# Patient Record
Sex: Male | Born: 2000 | Hispanic: No | Marital: Single | Smoking: Former smoker
Health system: Southern US, Community
[De-identification: ages and names within clinical notes are randomized; demographics above are authoritative.]

## PROBLEM LIST (undated history)

## (undated) DIAGNOSIS — A048 Other specified bacterial intestinal infections: Secondary | ICD-10-CM

---

## 2018-06-11 ENCOUNTER — Emergency Department
Admission: EM | Admit: 2018-06-11 | Discharge: 2018-06-11 | Disposition: A | Discharge status: Home / Self Care | Payer: BLUE CROSS/BLUE SHIELD | Attending: Emergency Medicine | Admitting: Emergency Medicine

## 2018-06-11 ENCOUNTER — Emergency Department: Payer: BLUE CROSS/BLUE SHIELD

## 2018-06-11 ENCOUNTER — Encounter: Payer: Self-pay | Admitting: Emergency Medicine

## 2018-06-11 DIAGNOSIS — R112 Nausea with vomiting, unspecified: Secondary | ICD-10-CM | POA: Diagnosis present

## 2018-06-11 DIAGNOSIS — R109 Unspecified abdominal pain: Secondary | ICD-10-CM | POA: Diagnosis not present

## 2018-06-11 DIAGNOSIS — R110 Nausea: Secondary | ICD-10-CM | POA: Diagnosis not present

## 2018-06-11 LAB — COMPREHENSIVE METABOLIC PANEL
ALT: 7 U/L (ref 0–44)
AST: 23 U/L (ref 15–41)
Albumin: 4.8 g/dL (ref 3.5–5.0)
Alkaline Phosphatase: 84 U/L (ref 38–126)
Anion gap: 8 (ref 5–15)
BUN: 13 mg/dL (ref 6–20)
CO2: 24 mmol/L (ref 22–32)
Calcium: 9.7 mg/dL (ref 8.9–10.3)
Chloride: 105 mmol/L (ref 98–111)
Creatinine, Ser: 1.23 mg/dL (ref 0.61–1.24)
GFR calc non Af Amer: >60 mL/min (ref >60)
Glucose, Bld: 97 mg/dL (ref 70–99)
Potassium: 3.6 mmol/L (ref 3.5–5.1)
Sodium: 137 mmol/L (ref 135–145)
Total Bilirubin: 1.5 mg/dL — ABNORMAL HIGH (ref 0.3–1.2)
Total Protein: 7.9 g/dL (ref 6.5–8.1)

## 2018-06-11 LAB — CBC
HCT: 44.6 % (ref 39.0–52.0)
Hemoglobin: 16 g/dL (ref 13.0–17.0)
MCH: 31 pg (ref 26.0–34.0)
MCHC: 35.9 g/dL (ref 30.0–36.0)
MCV: 86.4 fL (ref 80.0–100.0)
Platelets: 274 10*3/uL (ref 150–400)
RBC: 5.16 MIL/uL (ref 4.22–5.81)
RDW: 11.7 % (ref 11.5–15.5)
WBC: 6.9 10*3/uL (ref 4.0–10.5)
nRBC: 0 % (ref 0.0–0.2)

## 2018-06-11 LAB — URINALYSIS, COMPLETE (UACMP) WITH MICROSCOPIC
Bacteria, UA: NONE SEEN
Bilirubin Urine: NEGATIVE
Glucose, UA: NEGATIVE mg/dL
Hgb urine dipstick: NEGATIVE
Ketones, ur: 20 mg/dL — AB
Leukocytes, UA: NEGATIVE
Nitrite: NEGATIVE
Protein, ur: NEGATIVE mg/dL
Specific Gravity, Urine: 1.025 (ref 1.005–1.030)
Squamous Epithelial / HPF: NONE SEEN (ref 0–5)
pH: 6 (ref 5.0–8.0)

## 2018-06-11 LAB — LIPASE, BLOOD: Lipase: 26 U/L (ref 11–51)

## 2018-06-11 MED ORDER — ONDANSETRON 4 MG PO TBDP: 4.0000 mg | ORAL_TABLET | Freq: Once | ORAL | Status: DC

## 2018-06-11 MED ORDER — IOPAMIDOL (ISOVUE-300) INJECTION 61%
30.0000 mL | Freq: Once | INTRAVENOUS | Status: AC
  Administered 2018-06-11: 30 mL via ORAL
  Filled 2018-06-11: qty 30

## 2018-06-11 MED ORDER — SODIUM CHLORIDE 0.9 % IV BOLUS
1000.0000 mL | Freq: Once | INTRAVENOUS | Status: AC
  Administered 2018-06-11: 1000 mL via INTRAVENOUS

## 2018-06-11 MED ORDER — IOHEXOL 300 MG/ML  SOLN
100.0000 mL | Freq: Once | INTRAMUSCULAR | Status: AC | PRN
  Administered 2018-06-11: 100 mL via INTRAVENOUS
  Filled 2018-06-11: qty 100

## 2018-06-11 MED ORDER — ONDANSETRON 4 MG PO TBDP: 4.0000 mg | ORAL_TABLET | Freq: Four times a day (QID) | ORAL | 0 refills | Status: DC | PRN

## 2018-06-11 MED ORDER — ONDANSETRON HCL 4 MG/2ML IJ SOLN
4.0000 mg | Freq: Once | INTRAMUSCULAR | Status: AC
  Administered 2018-06-11: 4 mg via INTRAVENOUS
  Filled 2018-06-11: qty 2

## 2018-06-11 NOTE — Discharge Instructions (Addendum)

## 2018-06-11 NOTE — ED Provider Notes (Signed)
[facility] Emergency Department Provider Note   ____________________________________________   First MD Initiated Contact with Patient 06/11/18 1733     (approximate)  I have reviewed the triage vital signs and the nursing notes.   HISTORY  Chief Complaint Abdominal Pain; Nausea; and Emesis    HPI Tim Arnold is a 18 y.o. male here for evaluation of nausea and vomiting.  Patient reports his doctor gave him Truvada because he frequently will engage in high risk activities that could lead to HIV.  Reports he had thought about doing something, started the Truvada, but did not actually end up engaging in the high risk activity.  However, day after he started taking he started noticing he felt nauseated having some crampy abdominal pain and has vomited a couple of times since.  He discontinued the drug 2 days ago,  reports he is continued to feel intermittently nauseated and vomited once this morning.  No black or bloody emesis.  No diarrhea.  No fevers or chills.  Does report he has a "baseline" level of nausea, but seem to be quite a bit worse the last few days.  Denies any penile symptoms, rash, or urethral discharge.  No recent travel.  [facility] Reglan prescription which she reports has not been helpful, also seen in urgent care today and was told he needed to come to the ER to have this further evaluated  Reports the pain is inconsistent, presently mild and mid abdominal.  Denies any previous abdominal surgeries.  No chest pain or trouble breathing.  He reports that he is not HIV positive  History reviewed. No pertinent past medical history.  There are no active problems to display for this patient.   History reviewed. No pertinent surgical history.  Prior to Admission medications   Medication Sig Start Date End Date Taking? Authorizing Provider  ondansetron (ZOFRAN ODT) 4 MG disintegrating tablet Take 1  tablet (4 mg total) by mouth every 6 (six) hours as needed for nausea or vomiting. 06/11/18   Sharyn Creamer, MD    Allergies Patient has no allergy information on record.  No family history on file.  Social History Social History   Denies illicit drug use Does occasionally smoke   Review of Systems Constitutional: No fever/chills Eyes: No visual changes. ENT: No sore throat. Cardiovascular: Denies chest pain. Respiratory: Denies shortness of breath. Gastrointestinal: See HPI Genitourinary: Negative for dysuria. Musculoskeletal: Negative for back pain. Skin: Negative for rash. Neurological: Negative for headaches, areas of focal weakness or numbness.    ____________________________________________   PHYSICAL EXAM:  VITAL SIGNS: ED Triage Vitals  Enc Vitals Group     BP 06/11/18 1429 122/72     Pulse Rate 06/11/18 1429 70     Resp 06/11/18 1429 20     Temp 06/11/18 1429 98.2 F (36.8 C)     Temp Source 06/11/18 1429 Oral     SpO2 06/11/18 1429 100 %     Weight 06/11/18 1430 162 lb (73.5 kg)     Height 06/11/18 1430 6\' 4"  (1.93 m)     Head Circumference --      Peak Flow --      Pain Score 06/11/18 1430 6     Pain Loc --      Pain Edu? --      Excl. in GC? --     Constitutional: Alert and oriented. Well appearing and in no acute distress. Eyes:  Conjunctivae are normal. Head: Atraumatic. Nose: No congestion/rhinnorhea. Mouth/Throat: Mucous membranes are moist. Neck: No stridor.  Cardiovascular: Normal rate, regular rhythm. Grossly normal heart sounds.  Good peripheral circulation. Respiratory: Normal respiratory effort.  No retractions. Lungs CTAB. Gastrointestinal: Soft and ports mild generalized tenderness throughout, no obvious focal findings.  No rebound or guarding.  No focal pain to McBurney's point.. No distention. Musculoskeletal: No lower extremity tenderness nor edema. Neurologic:  Normal speech and language. No gross focal neurologic deficits are  appreciated.  Skin:  Skin is warm, dry and intact. No rash noted. Psychiatric: Mood and affect are normal. Speech and behavior are normal.  ____________________________________________   LABS (all labs ordered are listed, but only abnormal results are displayed)  Labs Reviewed  COMPREHENSIVE METABOLIC PANEL - Abnormal; Notable for the following components:      Result Value   Total Bilirubin 1.5 (*)    All other components within normal limits  URINALYSIS, COMPLETE (UACMP) WITH MICROSCOPIC - Abnormal; Notable for the following components:   Color, Urine YELLOW (*)    APPearance CLEAR (*)    Ketones, ur 20 (*)    All other components within normal limits  LIPASE, BLOOD  CBC   ____________________________________________  EKG   ____________________________________________  RADIOLOGY  Ct Abdomen Pelvis W Contrast  Result Date: 06/11/2018 CLINICAL DATA:  Acute generalized abdominal pain. EXAM: CT ABDOMEN AND PELVIS WITH CONTRAST TECHNIQUE: Multidetector CT imaging of the abdomen and pelvis was performed using the standard protocol following bolus administration of intravenous contrast. CONTRAST:  OMNIPAQUE IOHEXOL 300 MG/ML  SOLN COMPARISON:  None. FINDINGS: Lower chest: Normal heart size. Lung bases are clear. No pleural effusion. Hepatobiliary: Liver is normal in size and contour. No focal hepatic lesion is identified. Gallbladder is unremarkable. No intrahepatic or extrahepatic biliary ductal dilatation. Pancreas: Unremarkable Spleen: Unremarkable Adrenals/Urinary Tract: Adrenal glands are normal. Kidneys enhance symmetrically with contrast. No hydronephrosis. Urinary bladder is unremarkable. Stomach/Bowel: Normal morphology of the stomach. No evidence for bowel obstruction. Normal appendix. Normal appearance of the colon. Vascular/Lymphatic: Normal caliber abdominal aorta. No retroperitoneal lymphadenopathy. Reproductive: Unremarkable Other: Small amount of free fluid in the  pelvis. Musculoskeletal: No aggressive or acute appearing osseous lesions. IMPRESSION: Small amount of nonspecific fluid in the pelvis. Otherwise no acute process within the abdomen or pelvis. Electronically Signed   By: Annia Belt M.D.   On: 06/11/2018 19:04     CT scan results reviewed by me, also reviewed by Dr. Aleen Campi and discussed with him. ____________________________________________   PROCEDURES  Procedure(s) performed: None  Procedures  Critical Care performed: No  ____________________________________________   INITIAL IMPRESSION / ASSESSMENT AND PLAN / ED COURSE  Pertinent labs & imaging results that were available during my care of the patient were reviewed by me and considered in my medical decision making (see chart for details).   Differential diagnosis includes but is not limited to, abdominal perforation, aortic dissection, cholecystitis, appendicitis, diverticulitis, colitis, esophagitis/gastritis, kidney stone, pyelonephritis, urinary tract infection, aortic aneurysm. All are considered in decision and treatment plan. Based upon the patient's presentation and risk factors, I suspect the symptoms may very well be likely to his use of Truvada as these are common side effects with this medication.   Given this is the patient's third visit for abdominal pain in the last week I think it is reasonable proceed with CT imaging to exclude conditions such as early appendicitis or other intra-abdominal infection though my pretest probability is low.   Clinical Course as  of Jun 11 1950  Thu Jun 11, 2018  [id number] Dr. Aleen CampiPiscoya reviewed case and CT imaging with me at this time. Discussed trace fluid on CT. Dr. Aleen CampiPiscoya no specific findings and could be secondary to medication. Will discharge with careful return precautions and antiemetic.    [MQ]    Clinical Course User Index [MQ] Sharyn CreamerQuale, Kemyah Buser, MD    ----------------------------------------- 7:53 PM on  06/11/2018 -----------------------------------------  Patient reports he feels much better after receiving fluids and Zofran.  Resting comfortably.  Discussed careful return precautions and follow-up.  He will discontinue Truvada as well as high risk activities for HIV and reconnect with his primary care physician.  Careful abdominal pain return precautions discussed with patient is in agreement.  Return precautions and treatment recommendations and follow-up discussed with the patient who is agreeable with the plan.  ____________________________________________   FINAL CLINICAL IMPRESSION(S) / ED DIAGNOSES  Final diagnoses:  Nausea        Note:  This document was prepared using Dragon voice recognition software and may include unintentional dictation errors       Sharyn CreamerQuale, Eugenio Dollins, MD 06/11/18 1956

## 2018-06-11 NOTE — ED Triage Notes (Signed)
Pt reports started a new medication Truvada a week ago and that is when his sx;s started. Pt c/o lower abd pain, NV. Pt states that he stopped taking the meds 2 days ago but his abd is stil tender and he is still nauseated.

## 2018-07-06 ENCOUNTER — Other Ambulatory Visit: Payer: Self-pay

## 2018-07-06 ENCOUNTER — Emergency Department
Admission: EM | Admit: 2018-07-06 | Discharge: 2018-07-06 | Disposition: A | Discharge status: Home / Self Care | Payer: BLUE CROSS/BLUE SHIELD | Attending: Emergency Medicine | Admitting: Emergency Medicine

## 2018-07-06 ENCOUNTER — Emergency Department: Payer: BLUE CROSS/BLUE SHIELD

## 2018-07-06 DIAGNOSIS — R1032 Left lower quadrant pain: Secondary | ICD-10-CM

## 2018-07-06 DIAGNOSIS — R110 Nausea: Secondary | ICD-10-CM | POA: Diagnosis not present

## 2018-07-06 LAB — CBC
HCT: 46.3 % (ref 39.0–52.0)
Hemoglobin: 16.2 g/dL (ref 13.0–17.0)
MCH: 30.9 pg (ref 26.0–34.0)
MCHC: 35 g/dL (ref 30.0–36.0)
MCV: 88.2 fL (ref 80.0–100.0)
Platelets: 285 10*3/uL (ref 150–400)
RBC: 5.25 MIL/uL (ref 4.22–5.81)
RDW: 11.8 % (ref 11.5–15.5)
WBC: 9.5 10*3/uL (ref 4.0–10.5)
nRBC: 0 % (ref 0.0–0.2)

## 2018-07-06 LAB — COMPREHENSIVE METABOLIC PANEL
ALBUMIN: 4.9 g/dL (ref 3.5–5.0)
ALT: 9 U/L (ref 0–44)
AST: 20 U/L (ref 15–41)
Alkaline Phosphatase: 92 U/L (ref 38–126)
Anion gap: 7 (ref 5–15)
BUN: 14 mg/dL (ref 6–20)
CHLORIDE: 104 mmol/L (ref 98–111)
CO2: 27 mmol/L (ref 22–32)
Calcium: 9.9 mg/dL (ref 8.9–10.3)
Creatinine, Ser: 1.19 mg/dL (ref 0.61–1.24)
GFR calc Af Amer: >60 mL/min (ref >60)
GFR calc non Af Amer: >60 mL/min (ref >60)
Glucose, Bld: 140 mg/dL — ABNORMAL HIGH (ref 70–99)
Potassium: 4.2 mmol/L (ref 3.5–5.1)
Sodium: 138 mmol/L (ref 135–145)
Total Bilirubin: 1.2 mg/dL (ref 0.3–1.2)
Total Protein: 8.3 g/dL — ABNORMAL HIGH (ref 6.5–8.1)

## 2018-07-06 LAB — URINALYSIS, COMPLETE (UACMP) WITH MICROSCOPIC
Bacteria, UA: NONE SEEN
Bilirubin Urine: NEGATIVE
Glucose, UA: NEGATIVE mg/dL
Hgb urine dipstick: NEGATIVE
Ketones, ur: 20 mg/dL — AB
Leukocytes, UA: NEGATIVE
Nitrite: NEGATIVE
PROTEIN: NEGATIVE mg/dL
Specific Gravity, Urine: 1.029 (ref 1.005–1.030)
pH: 5 (ref 5.0–8.0)

## 2018-07-06 LAB — LIPASE, BLOOD: Lipase: 25 U/L (ref 11–51)

## 2018-07-06 MED ORDER — DICYCLOMINE HCL 20 MG PO TABS: 20.0000 mg | ORAL_TABLET | Freq: Three times a day (TID) | ORAL | 0 refills | Status: DC | PRN

## 2018-07-06 MED ORDER — ALUM & MAG HYDROXIDE-SIMETH 200-200-20 MG/5ML PO SUSP
30.0000 mL | Freq: Once | ORAL | Status: AC
  Administered 2018-07-06: 30 mL via ORAL
  Filled 2018-07-06: qty 30

## 2018-07-06 MED ORDER — DICYCLOMINE HCL 20 MG PO TABS
20.0000 mg | ORAL_TABLET | Freq: Once | ORAL | Status: AC
  Administered 2018-07-06: 20 mg via ORAL
  Filled 2018-07-06 (×2): qty 1

## 2018-07-06 MED ORDER — POLYETHYLENE GLYCOL 3350 17 G PO PACK: 17.0000 g | PACK | Freq: Every day | ORAL | 0 refills | Status: DC

## 2018-07-06 MED ORDER — SODIUM CHLORIDE 0.9% FLUSH: 3.0000 mL | Freq: Once | INTRAVENOUS | Status: DC

## 2018-07-06 MED ORDER — LIDOCAINE VISCOUS HCL 2 % MT SOLN
15.0000 mL | Freq: Once | OROMUCOSAL | Status: AC
  Administered 2018-07-06: 15 mL via ORAL
  Filled 2018-07-06: qty 15

## 2018-07-06 NOTE — ED Provider Notes (Signed)
[facility] Emergency Department Provider Note       Time seen: ----------------------------------------- 1:05 PM on 07/06/2018 -----------------------------------------   I have reviewed the triage vital signs and the nursing notes.  HISTORY   Chief Complaint Abdominal Pain    HPI Tim Arnold is a 18 y.o. male with no significant past medical history who presents to the ED for left-sided abdominal pain.  Patient was seen here around a month ago for the same thing without any specific diagnosis.  He underwent negative laboratory and CT imaging at that time.  He thought it may be due to an anti-HIV medication but he has stopped taking that.  He has been referred to a GI doctor for consultation.  He has not moved his bowels in several days, he is also been nauseous.  History reviewed. No pertinent past medical history.  There are no active problems to display for this patient.   History reviewed. No pertinent surgical history.  Allergies Patient has no allergy information on record.  Social History Social History   Tobacco Use  . Smoking status: Not on file  Substance Use Topics  . Alcohol use: Not on file  . Drug use: Not on file   Review of Systems Constitutional: Negative for fever. Cardiovascular: Negative for chest pain. Respiratory: Negative for shortness of breath. Gastrointestinal: Positive for abdominal pain, constipation Musculoskeletal: Negative for back pain. Skin: Negative for rash. Neurological: Negative for headaches, focal weakness or numbness.  All systems negative/normal/unremarkable except as stated in the HPI  ____________________________________________   PHYSICAL EXAM:  VITAL SIGNS: ED Triage Vitals  Enc Vitals Group     BP 07/06/18 1217 (!) 143/92     Pulse Rate 07/06/18 1217 79     Resp 07/06/18 1217 18     Temp 07/06/18 1217 98.4 F (36.9 C)     Temp src --      SpO2 07/06/18 1217 100 %     Weight  07/06/18 1218 162 lb (73.5 kg)     Height 07/06/18 1218 6\' 4"  (1.93 m)     Head Circumference --      Peak Flow --      Pain Score 07/06/18 1217 10     Pain Loc --      Pain Edu? --      Excl. in GC? --    Constitutional: Alert and oriented. Well appearing and in no distress. Eyes: Conjunctivae are normal. Normal extraocular movements. Cardiovascular: Normal rate, regular rhythm. No murmurs, rubs, or gallops. Respiratory: Normal respiratory effort without tachypnea nor retractions. Breath sounds are clear and equal bilaterally. No wheezes/rales/rhonchi. Gastrointestinal: Soft and nontender. Normal bowel sounds Musculoskeletal: Nontender with normal range of motion in extremities. No lower extremity tenderness nor edema. Neurologic:  Normal speech and language. No gross focal neurologic deficits are appreciated.  Skin:  Skin is warm, dry and intact. No rash noted. Psychiatric: Mood and affect are normal. Speech and behavior are normal.  ____________________________________________  ED COURSE:  As part of my medical decision making, I reviewed the following data within the electronic MEDICAL RECORD NUMBER History obtained from family if available, nursing notes, old chart and ekg, as well as notes from prior ED visits. Patient presented for abdominal pain, we will assess with labs and imaging as indicated at this time.   Procedures ____________________________________________   LABS (pertinent positives/negatives)  Labs Reviewed  COMPREHENSIVE METABOLIC PANEL - Abnormal; Notable for the following components:      Result  Value   Glucose, Bld 140 (*)    Total Protein 8.3 (*)    All other components within normal limits  URINALYSIS, COMPLETE (UACMP) WITH MICROSCOPIC - Abnormal; Notable for the following components:   Color, Urine YELLOW (*)    APPearance CLEAR (*)    Ketones, ur 20 (*)    All other components within normal limits  LIPASE, BLOOD  CBC    RADIOLOGY Images were viewed  by me  Abdomen 2 view IMPRESSION: No acute abdominal findings. ____________________________________________   DIFFERENTIAL DIAGNOSIS   Gas pain, constipation, IBS, renal colic  FINAL ASSESSMENT AND PLAN  Abdominal pain   Plan: The patient had presented for nonspecific abdominal pain. Patient's labs were normal. Patient's imaging was also normal.  He will be discharged with MiraLAX and Bentyl.  He is cleared for outpatient follow-up.   Ulice Dash, MD    Note: This note was generated in part or whole with voice recognition software. Voice recognition is usually quite accurate but there are transcription errors that can and very often do occur. I apologize for any typographical errors that were not detected and corrected.     Emily Filbert, MD 07/06/18 1344

## 2018-07-06 NOTE — ED Triage Notes (Addendum)
Pt comes via POV from home wit hc/o left lowert abdominal pain. Pt states he was seen here before for same issue and thought it was his medication. Pt states he hasn't taken the medication and it is still happening.  Pt states he went to urgent care and has a consultation with GI doctor. Pt states it is so severe. Pt states it is worse after he eats.  Pt states constipation. Pt states nausea

## 2018-09-28 ENCOUNTER — Other Ambulatory Visit: Payer: Self-pay

## 2018-09-28 ENCOUNTER — Emergency Department
Admission: EM | Admit: 2018-09-28 | Discharge: 2018-09-28 | Disposition: A | Discharge status: Home / Self Care | Payer: BLUE CROSS/BLUE SHIELD | Attending: Emergency Medicine | Admitting: Emergency Medicine

## 2018-09-28 DIAGNOSIS — Z79899 Other long term (current) drug therapy: Secondary | ICD-10-CM | POA: Insufficient documentation

## 2018-09-28 DIAGNOSIS — L559 Sunburn, unspecified: Secondary | ICD-10-CM | POA: Diagnosis not present

## 2018-09-28 DIAGNOSIS — R112 Nausea with vomiting, unspecified: Secondary | ICD-10-CM | POA: Insufficient documentation

## 2018-09-28 HISTORY — DX: Other specified bacterial intestinal infections: A04.8

## 2018-09-28 LAB — URINALYSIS, COMPLETE (UACMP) WITH MICROSCOPIC
Bilirubin Urine: NEGATIVE
Glucose, UA: NEGATIVE mg/dL
Hgb urine dipstick: NEGATIVE
Ketones, ur: 5 mg/dL — AB
Leukocytes,Ua: NEGATIVE
Nitrite: NEGATIVE
Protein, ur: 100 mg/dL — AB
Specific Gravity, Urine: 1.029 (ref 1.005–1.030)
pH: 8 (ref 5.0–8.0)

## 2018-09-28 LAB — CBC
HCT: 43.4 % (ref 39.0–52.0)
Hemoglobin: 15.4 g/dL (ref 13.0–17.0)
MCH: 30.9 pg (ref 26.0–34.0)
MCHC: 35.5 g/dL (ref 30.0–36.0)
MCV: 87 fL (ref 80.0–100.0)
Platelets: 235 10*3/uL (ref 150–400)
RBC: 4.99 MIL/uL (ref 4.22–5.81)
RDW: 11.4 % — ABNORMAL LOW (ref 11.5–15.5)
WBC: 8.3 10*3/uL (ref 4.0–10.5)
nRBC: 0 % (ref 0.0–0.2)

## 2018-09-28 LAB — COMPREHENSIVE METABOLIC PANEL
ALT: 9 U/L (ref 0–44)
AST: 25 U/L (ref 15–41)
Albumin: 4.9 g/dL (ref 3.5–5.0)
Alkaline Phosphatase: 87 U/L (ref 38–126)
Anion gap: 10 (ref 5–15)
BUN: 11 mg/dL (ref 6–20)
CO2: 23 mmol/L (ref 22–32)
Calcium: 9.7 mg/dL (ref 8.9–10.3)
Chloride: 105 mmol/L (ref 98–111)
Creatinine, Ser: 1.06 mg/dL (ref 0.61–1.24)
GFR calc Af Amer: >60 mL/min (ref >60)
GFR calc non Af Amer: >60 mL/min (ref >60)
Glucose, Bld: 115 mg/dL — ABNORMAL HIGH (ref 70–99)
Potassium: 3.5 mmol/L (ref 3.5–5.1)
Sodium: 138 mmol/L (ref 135–145)
Total Bilirubin: 2.1 mg/dL — ABNORMAL HIGH (ref 0.3–1.2)
Total Protein: 7.5 g/dL (ref 6.5–8.1)

## 2018-09-28 LAB — LIPASE, BLOOD: Lipase: 27 U/L (ref 11–51)

## 2018-09-28 MED ORDER — SODIUM CHLORIDE 0.9% FLUSH: 3.0000 mL | Freq: Once | INTRAVENOUS | Status: DC

## 2018-09-28 MED ORDER — FAMOTIDINE IN NACL 20-0.9 MG/50ML-% IV SOLN
20.0000 mg | Freq: Once | INTRAVENOUS | Status: AC
  Administered 2018-09-28: 20 mg via INTRAVENOUS
  Filled 2018-09-28: qty 50

## 2018-09-28 MED ORDER — FAMOTIDINE 20 MG PO TABS: 20.0000 mg | ORAL_TABLET | Freq: Two times a day (BID) | ORAL | 0 refills | Status: DC

## 2018-09-28 MED ORDER — ONDANSETRON 4 MG PO TBDP: 4.0000 mg | ORAL_TABLET | Freq: Once | ORAL | Status: DC | PRN

## 2018-09-28 MED ORDER — SODIUM CHLORIDE 0.9 % IV BOLUS
1000.0000 mL | Freq: Once | INTRAVENOUS | Status: AC
  Administered 2018-09-28: 1000 mL via INTRAVENOUS

## 2018-09-28 MED ORDER — METOCLOPRAMIDE HCL 5 MG PO TABS: 5.0000 mg | ORAL_TABLET | Freq: Three times a day (TID) | ORAL | 0 refills | Status: DC | PRN

## 2018-09-28 MED ORDER — ONDANSETRON HCL 4 MG/2ML IJ SOLN
4.0000 mg | Freq: Once | INTRAMUSCULAR | Status: AC
  Administered 2018-09-28: 4 mg via INTRAVENOUS
  Filled 2018-09-28: qty 2

## 2018-09-28 MED ORDER — ONDANSETRON 4 MG PO TBDP: 4.0000 mg | ORAL_TABLET | Freq: Three times a day (TID) | ORAL | 0 refills | Status: DC | PRN

## 2018-09-28 NOTE — ED Notes (Signed)
See triage note  States he drank a lot of ETOH    Has not been able to keep anything down today  Feeling weak

## 2018-09-28 NOTE — ED Provider Notes (Signed)
[facility] Emergency Department Provider Note ____________________________________________  Time seen: 1524  I have reviewed the triage vital signs and the nursing notes.  HISTORY  Chief Complaint  Dehydration  HPI Tim Arnold is a 18 y.o. male presents to the ED via EMS from home, for evaluation of alcohol induced nausea and vomiting.  He admits to drinking alcohol for the last 24 hours, and today has been unable to keep anything down.  He has episodes of lethargy and vomiting.  He denies any bloody or bilious emesis at this time.  He also reports he fell asleep outside all day, and did not eat anything. He reports to sunburned skin of the back.  He denies any fevers, chills, chest pain, shortness of breath, weakness or syncope.  Denies any significant medical history and takes no daily medications. He was recently treated for H. Pylori.   Past Medical History:  Diagnosis Date  . H. pylori infection     There are no active problems to display for this patient.   History reviewed. No pertinent surgical history.  Prior to Admission medications   Medication Sig Start Date End Date Taking? Authorizing Provider  lamoTRIgine (LAMICTAL) 25 MG tablet Take 25 mg by mouth daily.   Yes [provider]  famotidine (PEPCID) 20 MG tablet Take 1 tablet (20 mg total) by mouth 2 (two) times daily for 30 days. 09/28/18 10/28/18  Janeene Sand, Charlesetta Ivory, PA-C  metoCLOPramide (REGLAN) 5 MG tablet Take 1 tablet (5 mg total) by mouth every 8 (eight) hours as needed for nausea or vomiting. 09/28/18   Pressley Barsky, Charlesetta Ivory, PA-C  ondansetron (ZOFRAN ODT) 4 MG disintegrating tablet Take 1 tablet (4 mg total) by mouth every 8 (eight) hours as needed. 09/28/18   Corlene Sabia, Charlesetta Ivory, PA-C    Allergies Patient has no known allergies.  No family history on file.  Social History Social History   Tobacco Use  . Smoking status: Never Smoker  . Smokeless tobacco: Never  Used  Substance Use Topics  . Alcohol use: Yes  . Drug use: Yes    Review of Systems  Constitutional: Negative for fever. Eyes: Negative for visual changes. ENT: Negative for sore throat. Cardiovascular: Negative for chest pain. Respiratory: Negative for shortness of breath. Gastrointestinal: Negative for abdominal pain, and diarrhea. Reports nausea and vomiting. Genitourinary: Negative for dysuria. Musculoskeletal: Negative for back pain. Skin: Negative for rash. Reports sunburn Neurological: Negative for headaches, focal weakness or numbness. ____________________________________________  PHYSICAL EXAM:  VITAL SIGNS: ED Triage Vitals  Enc Vitals Group     BP 09/28/18 1448 138/81     Pulse Rate 09/28/18 1448 76     Resp 09/28/18 1448 17     Temp 09/28/18 1448 98 F (36.7 C)     Temp Source 09/28/18 1448 Oral     SpO2 09/28/18 1448 97 %     Weight 09/28/18 1449 160 lb (72.6 kg)     Height 09/28/18 1449 6\' 4"  (1.93 m)     Head Circumference --      Peak Flow --      Pain Score 09/28/18 1449 4     Pain Loc --      Pain Edu? --      Excl. in GC? --     Constitutional: Alert and oriented. Well appearing and in no distress. Head: Normocephalic and atraumatic. Eyes: Conjunctivae are anincteric. Normal extraocular movements Cardiovascular: Normal rate, regular rhythm. Normal distal pulses.  Respiratory: Normal respiratory effort. No wheezes/rales/rhonchi. Gastrointestinal: Soft and nontender. No distention. Normal bowel sounds noted.  Musculoskeletal: Nontender with normal range of motion in all extremities.  Neurologic:  Normal gait without ataxia. Normal speech and language. No gross focal neurologic deficits are appreciated. Skin:  Skin is warm, dry and intact. No rash noted. ____________________________________________   LABS (pertinent positives/negatives) Labs Reviewed  COMPREHENSIVE METABOLIC PANEL - Abnormal; Notable for the following components:      Result  Value   Glucose, Bld 115 (*)    Total Bilirubin 2.1 (*)    All other components within normal limits  CBC - Abnormal; Notable for the following components:   RDW 11.4 (*)    All other components within normal limits  URINALYSIS, COMPLETE (UACMP) WITH MICROSCOPIC - Abnormal; Notable for the following components:   Color, Urine AMBER (*)    APPearance TURBID (*)    Ketones, ur 5 (*)    Protein, ur 100 (*)    Bacteria, UA FEW (*)    All other components within normal limits  LIPASE, BLOOD  ____________________________________________  PROCEDURES  Procedures NS 1000 ml bolus Zofran 4 mg IVP ____________________________________________  INITIAL IMPRESSION / ASSESSMENT AND PLAN / ED COURSE  Tim Arnold was evaluated in Emergency Department on 09/28/2018 for the symptoms described in the history of present illness. He was evaluated in the context of the global COVID-19 pandemic, which necessitated consideration that the patient might be at risk for infection with the SARS-CoV-2 virus that causes COVID-19. Institutional protocols and algorithms that pertain to the evaluation of patients at risk for COVID-19 are in a state of rapid change based on information released by regulatory bodies including the CDC and federal and state organizations. These policies and algorithms were followed during the patient's care in the ED.  Patient presents to the ED for onset of nausea and vomiting following alcohol consumption.  Patient reports he was well until prior to the onset of drinking and subsequent nausea and vomiting.  He presents today for evaluation of his nausea, and has been without emesis during his course in the ED.  Labs are reassuring as it shows no acute dehydration.  Lipase is normal, and urinalysis is also within normal limits.  Patient has been with IV fluids, IV Pepcid, and IV Zofran.  He reports improvement of his symptoms.  Will be discharged with prescriptions for Zofran as well as  Pepcid to take as needed.  He may follow-up with GI medicine given his history of H. pylori in the past.  Otherwise return precautions have been reviewed. ____________________________________________  FINAL CLINICAL IMPRESSION(S) / ED DIAGNOSES  Final diagnoses:  Non-intractable vomiting with nausea, unspecified vomiting type      Lissa HoardMenshew, Almin Livingstone V Bacon, PA-C 09/28/18 1850    Minna AntisPaduchowski, Kevin, MD 09/28/18 2233

## 2018-09-28 NOTE — ED Notes (Signed)
Resting at present   No vomiting since arrival to room

## 2018-09-28 NOTE — Discharge Instructions (Signed)
Your exam and labs are normal at this time. There is no indication of dehydration or acute abdominal findings. You should continue to hydrate with Powerade/Gatorade and eat carbohydrate-rich foods. Take the nausea medicine as needed and the Pepcid as directed. Follow-up with your GI provider for ongoing concerns.

## 2018-09-28 NOTE — ED Triage Notes (Signed)
Pt comes into the ED via EMS from home, states he had been drinking ETOH for the past 24hrs and today not able to keep anything down, vomiting . States he was outside all day, didn't eat anything.Marland Kitchen

## 2019-04-21 ENCOUNTER — Ambulatory Visit: Payer: BLUE CROSS/BLUE SHIELD | Admitting: Gastroenterology

## 2019-05-07 ENCOUNTER — Other Ambulatory Visit: Payer: Self-pay

## 2019-05-07 ENCOUNTER — Encounter (HOSPITAL_COMMUNITY): Payer: Self-pay | Admitting: Emergency Medicine

## 2019-05-07 ENCOUNTER — Ambulatory Visit (HOSPITAL_COMMUNITY)
Admission: EM | Admit: 2019-05-07 | Discharge: 2019-05-07 | Disposition: A | Discharge status: Home / Self Care | Payer: BC Managed Care – PPO | Attending: Family Medicine | Admitting: Family Medicine

## 2019-05-07 DIAGNOSIS — R000 Tachycardia, unspecified: Secondary | ICD-10-CM | POA: Insufficient documentation

## 2019-05-07 DIAGNOSIS — Z87891 Personal history of nicotine dependence: Secondary | ICD-10-CM | POA: Insufficient documentation

## 2019-05-07 DIAGNOSIS — Z20828 Contact with and (suspected) exposure to other viral communicable diseases: Secondary | ICD-10-CM | POA: Insufficient documentation

## 2019-05-07 DIAGNOSIS — Z79899 Other long term (current) drug therapy: Secondary | ICD-10-CM | POA: Insufficient documentation

## 2019-05-07 DIAGNOSIS — R0789 Other chest pain: Secondary | ICD-10-CM | POA: Insufficient documentation

## 2019-05-07 DIAGNOSIS — R064 Hyperventilation: Secondary | ICD-10-CM | POA: Diagnosis not present

## 2019-05-07 MED ORDER — ACETAMINOPHEN 325 MG PO TABS
ORAL_TABLET | ORAL | Status: AC
  Filled 2019-05-07: qty 2

## 2019-05-07 MED ORDER — ACETAMINOPHEN 325 MG PO TABS
650.0000 mg | ORAL_TABLET | Freq: Once | ORAL | Status: AC
  Administered 2019-05-07: 15:00:00 650 mg via ORAL

## 2019-05-07 NOTE — ED Provider Notes (Addendum)
[facility]    CSN: [id number] Arrival date & time: 05/07/19  1428      History   Chief Complaint Chief Complaint  Patient presents with  . Chest Pain  . Tachycardia  . Hyperventilating    HPI Tim Arnold is a 18 y.o. male.   HPI Patient is here for tachycardia.  He is having acute anxiety about his rapid heart rate.  He developed a rapid heartbeat while he was exercising.  He is here because it is failing to go down with rest.  His fast heart rate is probably exacerbated by the fact that he had caffeine prior to exercise.  He also took an Adderall this morning.  He states that Adderall was only a 5 mg tablet, but is reasonably new, he has only been on it for a couple of days. He is feeling some chest pressure.  No radiation. No dizziness or lightheadedness. No history of heart disease in the past. Past Medical History:  Diagnosis Date  . H. pylori infection     There are no problems to display for this patient.   History reviewed. No pertinent surgical history.     Home Medications    Prior to Admission medications   Medication Sig Start Date End Date Taking? Authorizing Provider  ADDERALL XR 5 MG 24 hr capsule Take 5 mg by mouth every morning. 04/28/19  Yes [provider]  lamoTRIgine (LAMICTAL) 200 MG tablet Take 200 mg by mouth daily. 03/30/19  Yes [provider]  famotidine (PEPCID) 20 MG tablet Take 1 tablet (20 mg total) by mouth 2 (two) times daily for 30 days. 09/28/18 10/28/18  Menshew, Charlesetta Ivory, PA-C  metoCLOPramide (REGLAN) 5 MG tablet Take 1 tablet (5 mg total) by mouth every 8 (eight) hours as needed for nausea or vomiting. 09/28/18 05/07/19  Menshew, Charlesetta Ivory, PA-C    Family History Family History  Problem Relation Age of Onset  . Healthy Mother   . Hypertension Father   . Healthy Sister   . Healthy Brother     Social History Social History   Tobacco Use  . Smoking status: Former Smoker    Quit  date: 11/05/2018    Years since quitting: 0.5  . Smokeless tobacco: Never Used  Substance Use Topics  . Alcohol use: Yes  . Drug use: Not Currently    Types: Marijuana     Allergies   Patient has no known allergies.   Review of Systems Review of Systems  Constitutional: Negative for chills and fever.  HENT: Negative for congestion and hearing loss.   Eyes: Negative for pain.  Respiratory: Negative for cough and shortness of breath.   Cardiovascular: Positive for palpitations. Negative for chest pain and leg swelling.       Rapid heartrate  Gastrointestinal: Negative for abdominal pain, constipation and diarrhea.  Genitourinary: Negative for dysuria and frequency.  Musculoskeletal: Negative for myalgias.  Neurological: Negative for dizziness, seizures and headaches.  Psychiatric/Behavioral: The patient is not nervous/anxious.      Physical Exam Triage Vital Signs ED Triage Vitals  Enc Vitals Group     BP 05/07/19 1430 (!) 142/80     Pulse Rate 05/07/19 1430 (!) 104     Resp 05/07/19 1430 20     Temp 05/07/19 1430 (!) 97.5 F (36.4 C)     Temp Source 05/07/19 1430 Oral     SpO2 05/07/19 1430 100 %     Weight --  Height --      Head Circumference --      Peak Flow --      Pain Score 05/07/19 1449 5     Pain Loc --      Pain Edu? --      Excl. in [place]? --    No data found.  Updated Vital Signs BP 135/72 (BP Location: Right Arm)   Pulse 84   Temp (!) 97.5 F (36.4 C) (Oral)   Resp 12   SpO2 100%      Physical Exam Constitutional:      General: He is not in acute distress.    Appearance: He is well-developed and normal weight.     Comments: Tall and lean.  Visibly anxious  HENT:     Head: Normocephalic and atraumatic.     Nose: Nose normal. No congestion.     Mouth/Throat:     Mouth: Mucous membranes are moist.     Pharynx: No posterior oropharyngeal erythema.  Eyes:     Conjunctiva/sclera: Conjunctivae normal.     Pupils: Pupils are equal, round,  and reactive to light.  Neck:     Thyroid: No thyromegaly.  Cardiovascular:     Rate and Rhythm: Normal rate and regular rhythm.     Heart sounds: Normal heart sounds.  Pulmonary:     Effort: Pulmonary effort is normal. No respiratory distress.     Breath sounds: Normal breath sounds. No wheezing or rales.  Abdominal:     General: There is no distension.     Palpations: Abdomen is soft.  Musculoskeletal:        General: Normal range of motion.     Cervical back: Normal range of motion and neck supple.     Right lower leg: No edema.     Left lower leg: No edema.  Skin:    General: Skin is warm and dry.  Neurological:     General: No focal deficit present.     Mental Status: He is alert.  Psychiatric:     Comments: anxiety      UC Treatments / Results  Labs (all labs ordered are listed, but only abnormal results are displayed) Labs Reviewed  NOVEL CORONAVIRUS, NAA (HOSP ORDER, SEND-OUT TO REF LAB; TAT 18-24 HRS)    EKg-sinus tachycardia.  No ST or T wave changes.  Compared with prior   Radiology No results found.  Procedures Procedures  Medications Ordered in UC Medications  acetaminophen (TYLENOL) tablet 650 mg (650 mg Oral Given 05/07/19 1514)    Initial Impression / Assessment and Plan / UC Course  I have reviewed the triage vital signs and the nursing notes.  Pertinent labs & imaging results that were available during my care of the patient were reviewed by me and considered in my medical decision making (see chart for details).    40 minutes after arriving the patient is still anxious although his heart rate has come down.  He now complains that his throat hurts, states "it is burning".  Throat exam is normal. Will give tylenol and warm blankets and re assess VS Repeat VS reassuring. Patient desires COVID test   Final Clinical Impressions(s) / UC Diagnoses   Final diagnoses:  Sinus tachycardia     Discharge Instructions     Home to rest The  COVID test will be available through My Chart Avoid caffeine Return as needed    ED Prescriptions    None  PDMP not reviewed this encounter.   Eustace MooreNelson, Rima Blizzard Sue, MD 05/07/19 1540    Eustace MooreNelson, Jaymir Struble Sue, MD 05/18/19 2111

## 2019-05-07 NOTE — ED Triage Notes (Addendum)
Pt here with hyperventilating in the lobby, chest pain and elevated heart rate.  Pt taken to triage room where his breathing was brought under control and EKG done.  Pt is resting comfortably in the chair during intake.  He states he has a sharp/shooting left lower chest pain that goes from 4-6/10 pain.  His pain in his chest is also reproducible.  Dr. Meda Coffee was brought to the bedside in the triage room.  Pt reports new use of Adderall 3-5 days ago, 5mg .  He also had a 200mg  shot of caffeine in his coffee from [place] and then went and worked out.  Pt is a three year graduate from college and is studying for his LSAT.

## 2019-05-07 NOTE — Discharge Instructions (Addendum)
Home to rest The COVID test will be available through My Chart Avoid caffeine Return as needed

## 2019-05-09 LAB — NOVEL CORONAVIRUS, NAA (HOSP ORDER, SEND-OUT TO REF LAB; TAT 18-24 HRS): SARS-CoV-2, NAA: NOT DETECTED

## 2019-06-23 ENCOUNTER — Other Ambulatory Visit: Payer: Self-pay

## 2019-06-23 ENCOUNTER — Ambulatory Visit: Payer: BC Managed Care – PPO | Admitting: Gastroenterology

## 2019-06-23 VITALS — BP 121/73 | HR 83 | Temp 98.0°F | Ht 76.0 in | Wt 162.2 lb

## 2019-06-23 DIAGNOSIS — R109 Unspecified abdominal pain: Secondary | ICD-10-CM

## 2019-06-23 NOTE — Progress Notes (Signed)
Tim Mood MD, MRCP(U.K) [address]  [address]  [place], [place] [address]  Main: [phone]  Fax: [phone]   Gastroenterology Consultation  Referring Provider:     No ref. provider found Primary Care Physician:  Patient, No Pcp Per Primary Gastroenterologist:  Dr. Wyline Arnold  Reason for Consultation:     Evaluation for the need of procedures        HPI:   Tim Arnold is a 19 y.o. y/o male has previously been seen by: [facility] gastroenterology Last in February 2020 for nausea and vomiting at that time was ongoing for 6 weeks.  Had multiple ER visit..  CT scan of the abdomen in January 2020 demonstrates small amount of nonspecific fluid in the pelvis.  Did have some constipation at that point of time.  He says he is here today because he was told to come and see [place].  He denies any symptoms particularly constipation, diarrhea, rectal bleeding, abdominal pain, weight loss.  He feels well.  He says he was told at some point he needed an EGD and colonoscopy and wanted to find out if he needed to get them done.  He says he has been screened for sexually transmitted diseases and has been immunized for hepatitis a and B. Past Medical History:  Diagnosis Date  . H. pylori infection     No past surgical history on file.  Prior to Admission medications   Medication Sig Start Date End Date Taking? Authorizing Provider  ADDERALL XR 5 MG 24 hr capsule Take 5 mg by mouth every morning. 04/28/19  Yes [provider]  lamoTRIgine (LAMICTAL) 200 MG tablet Take 200 mg by mouth daily. 03/30/19  Yes [provider]  famotidine (PEPCID) 20 MG tablet Take 1 tablet (20 mg total) by mouth 2 (two) times daily for 30 days. 09/28/18 10/28/18  Arnold, Tim Ivory, PA-C  metoCLOPramide (REGLAN) 5 MG tablet Take 1 tablet (5 mg total) by mouth every 8 (eight) hours as needed for nausea or vomiting. 09/28/18 05/07/19  Arnold, Tim Ivory, PA-C    Family History  Problem Relation  Age of Onset  . Healthy Mother   . Hypertension Father   . Healthy Sister   . Healthy Brother      Social History   Tobacco Use  . Smoking status: Former Smoker    Quit date: 11/05/2018    Years since quitting: 0.6  . Smokeless tobacco: Never Used  Substance Use Topics  . Alcohol use: Yes  . Drug use: Not Currently    Types: Marijuana    Allergies as of 06/23/2019  . (No Known Allergies)    Review of Systems:    All systems reviewed and negative except where noted in HPI.   Physical Exam:  BP 121/73   Pulse 83   Temp 98 F (36.7 C)   Ht 6\' 4"  (1.93 m)   Wt 162 lb 3.2 oz (73.6 kg)   BMI 19.74 kg/m  No LMP for male patient. Psych:  Alert and cooperative. Normal Arnold and affect. General:   Alert,  Well-developed, well-nourished, pleasant and cooperative in NAD Head:  Normocephalic and atraumatic. Eyes:  Sclera clear, no icterus.   Conjunctiva pink. Lungs:  Respirations even and unlabored.  Clear throughout to auscultation.   No wheezes, crackles, or rhonchi. No acute distress. Heart:  Regular rate and rhythm; no murmurs, clicks, rubs, or gallops. Abdomen:  Normal bowel sounds.  No bruits.  Soft, non-tender  and non-distended without masses, hepatosplenomegaly or hernias noted.  No guarding or rebound tenderness.    Neurologic:  Alert and oriented x3;  grossly normal neurologically. Psych:  Alert and cooperative. Normal Arnold and affect.  Imaging Studies: No results found.  Assessment and Plan:   Tim Arnold is a 19 y.o. y/o male had abdominal pain about a year back and was seen by [facility] gastroenterology and at that time he was suggested to have an EGD and colonoscopy.  He did not proceed with the same at that point of time.  He is here today to figure out if he needs them to be done.  At present he had absolutely no GI symptoms whatsoever.  Therefore I suggested that there was no indication for a procedure at this point of time.  I did mention to him that if he were to  develop any form of symptoms to contact me right away  Follow up in as needed  Dr Tim Bellows MD,MRCP(U.K)

## 2019-08-15 IMAGING — CR DG ABDOMEN 2V
1 series · 3 of 3 positions shown · non-contrast
Comparison: None.

CLINICAL DATA: LEFT lower abdominal pain

EXAM:
ABDOMEN - 2 VIEW

[Series 1: dg abd 2 views · 0.14mm/px · 3 of 3 slices shown]
[im 1/3]
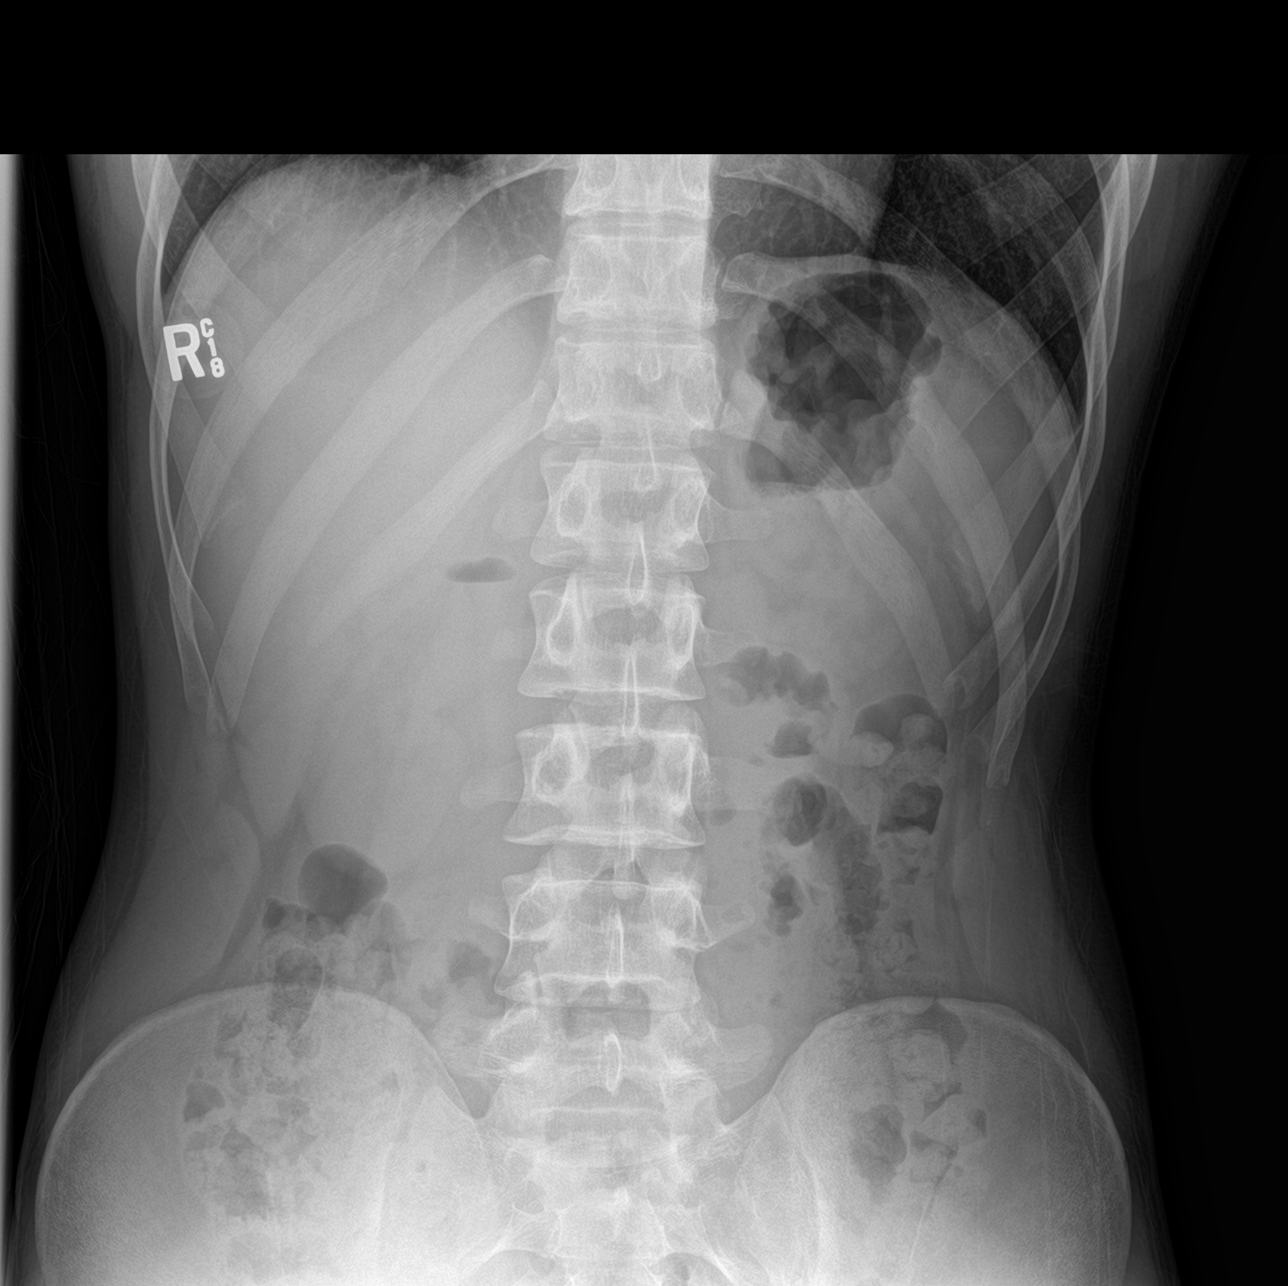
[im 2/3]
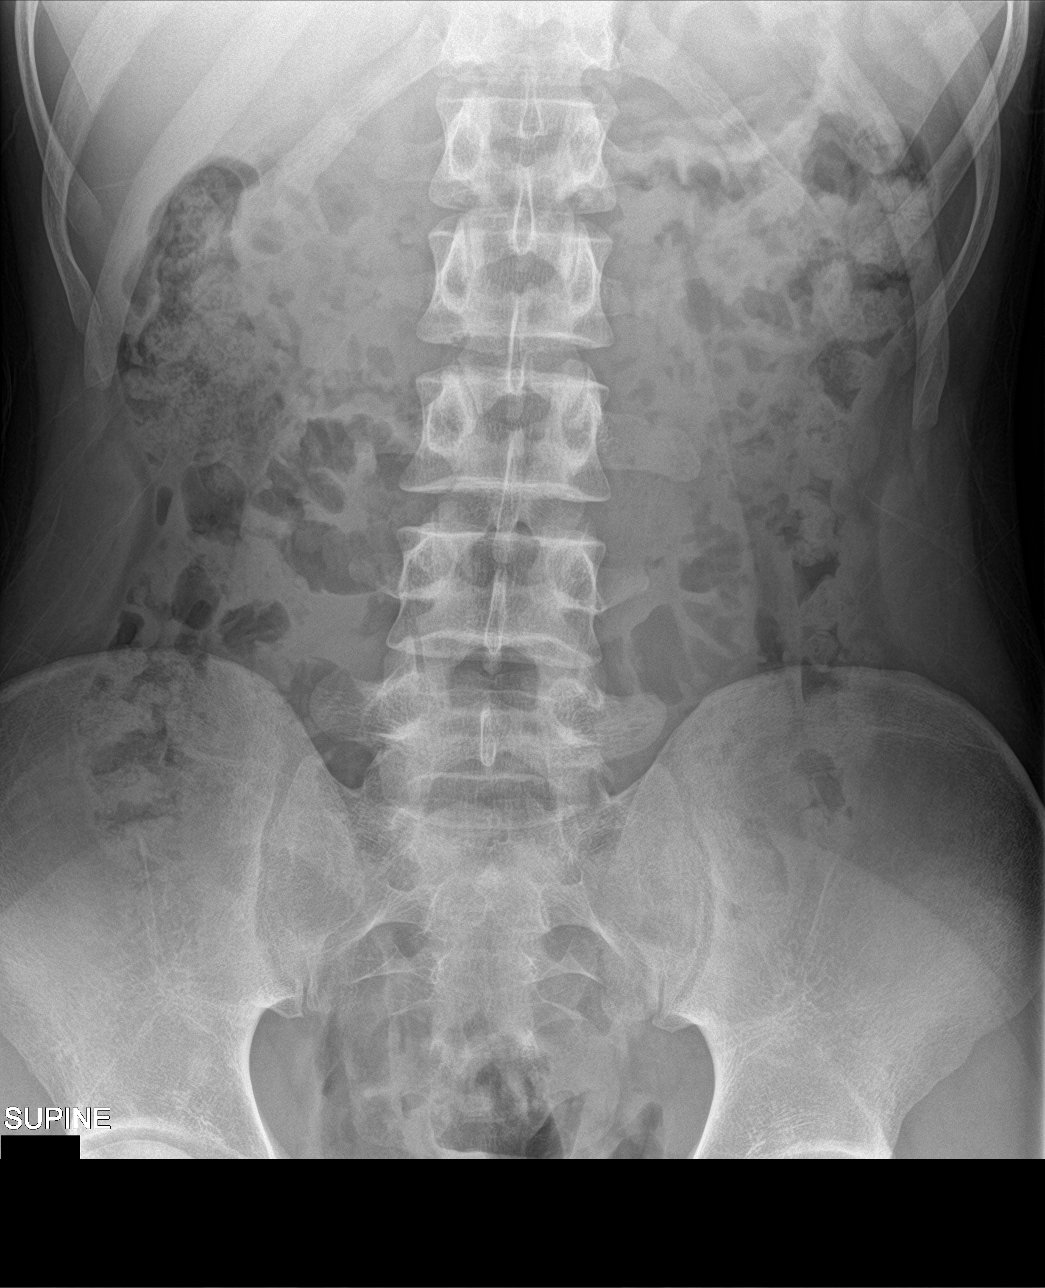
[im 3/3]
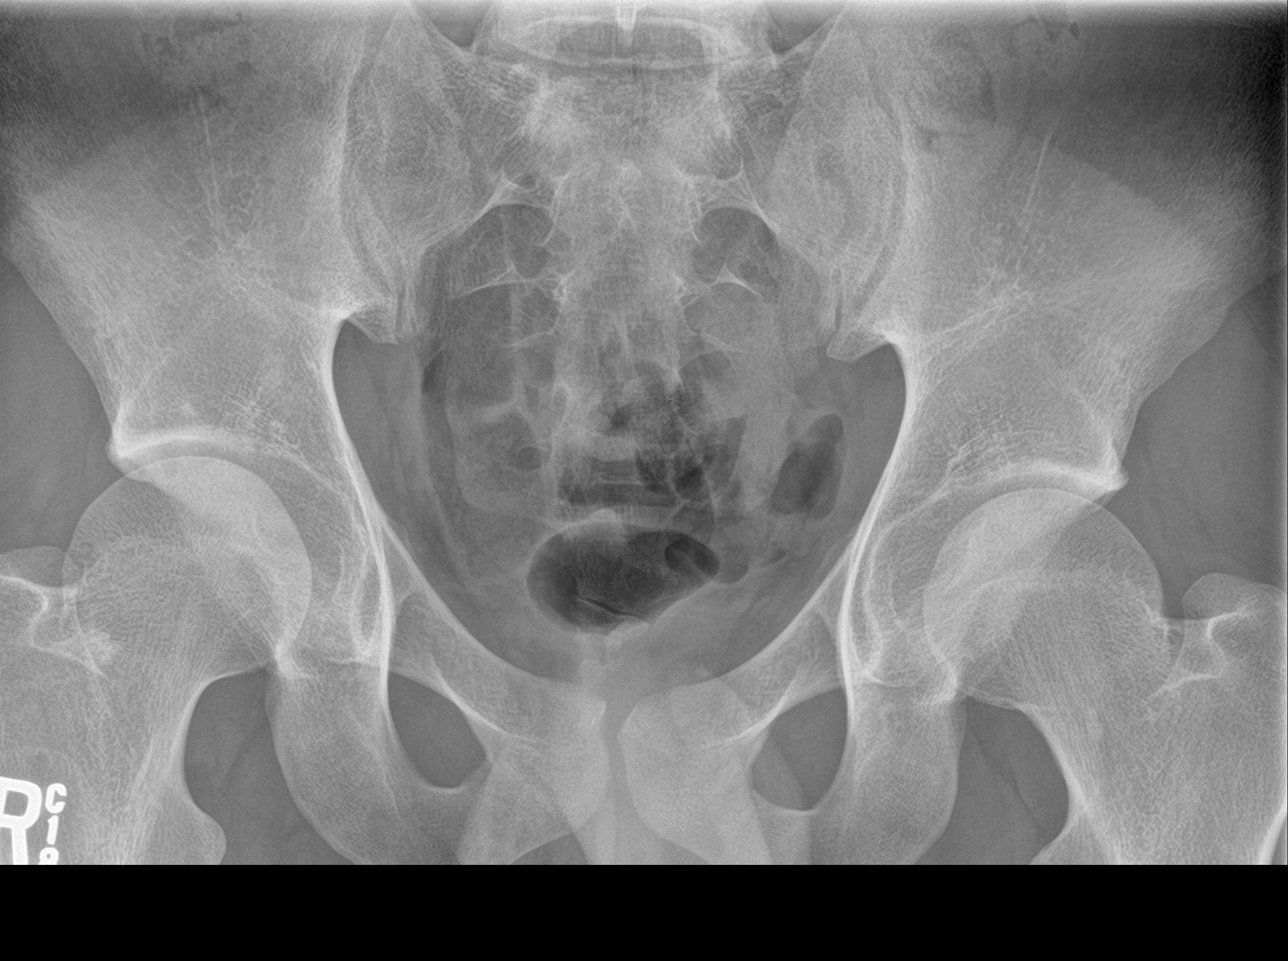

[3 of 3 positions shown; findings below may reference images not displayed]

FINDINGS: No dilated loops of large or small bowel. Gas and stool in the
rectum. No pathologic calcifications. No organomegaly. No acute
osseous abnormality
IMPRESSION: No acute abdominal findings.

## 2019-12-08 ENCOUNTER — Emergency Department (HOSPITAL_COMMUNITY)
Admission: EM | Admit: 2019-12-08 | Discharge: 2019-12-08 | Disposition: A | Discharge status: Home / Self Care | Payer: BC Managed Care – PPO | Attending: Emergency Medicine | Admitting: Emergency Medicine

## 2019-12-08 ENCOUNTER — Encounter (HOSPITAL_COMMUNITY): Payer: Self-pay | Admitting: Emergency Medicine

## 2019-12-08 DIAGNOSIS — Z87891 Personal history of nicotine dependence: Secondary | ICD-10-CM | POA: Insufficient documentation

## 2019-12-08 DIAGNOSIS — R21 Rash and other nonspecific skin eruption: Secondary | ICD-10-CM | POA: Diagnosis not present

## 2019-12-08 DIAGNOSIS — J029 Acute pharyngitis, unspecified: Secondary | ICD-10-CM | POA: Diagnosis not present

## 2019-12-08 DIAGNOSIS — R079 Chest pain, unspecified: Secondary | ICD-10-CM | POA: Diagnosis present

## 2019-12-08 MED ORDER — PREDNISONE 20 MG PO TABS
60.0000 mg | ORAL_TABLET | Freq: Once | ORAL | Status: AC
  Administered 2019-12-08: 60 mg via ORAL
  Filled 2019-12-08: qty 3

## 2019-12-08 MED ORDER — DIPHENHYDRAMINE HCL 25 MG PO CAPS
25.0000 mg | ORAL_CAPSULE | Freq: Once | ORAL | Status: AC
  Administered 2019-12-08: 25 mg via ORAL
  Filled 2019-12-08: qty 1

## 2019-12-08 MED ORDER — FAMOTIDINE 20 MG PO TABS
20.0000 mg | ORAL_TABLET | Freq: Once | ORAL | Status: AC
  Administered 2019-12-08: 20 mg via ORAL
  Filled 2019-12-08: qty 1

## 2019-12-08 NOTE — ED Notes (Signed)
Patient given discharge instructions. Questions were answered. Patient verbalized understanding of discharge instructions and care at home.  

## 2019-12-08 NOTE — ED Provider Notes (Signed)
Patient placed in Quick Look pathway, seen and evaluated   Chief Complaint: allergic reaction  HPI:   Patient allergic to shrimp. About 4:30pm ate crab. Thinks it may have touched some shrimp. Complaining of chest tightness and "scratchy" throat.  ROS: Scratchy throat (one)  Physical Exam:   Gen: No distress  Neuro: Awake and Alert  Skin: Warm    Focused Exam:   Tolerating oral secretions, No difficulty swallowing, No oral swelling.  No increased work of breathing. Lung sounds normal.  No hives, swelling noted.   Initiation of care has begun. The patient has been counseled on the process, plan, and necessity for staying for the completion/evaluation, and the remainder of the medical screening examination   Concepcion Living 12/08/19 1707    Melene Plan, DO 12/08/19 1823

## 2019-12-08 NOTE — ED Provider Notes (Signed)
[facility] EMERGENCY DEPARTMENT Provider Note   CSN: [id number] Arrival date & time: 12/08/19  1651     History Chief Complaint  Patient presents with  . Allergic Reaction    Bairon Klemann is a 19 y.o. male presents with sore throat and chest pain.  Patient states that he ate crab around 430 this afternoon.  He immediately felt chest pain and felt like his throat was "inflamed".  He states that this has happened in the past and thinks it is due to ingesting seafood.  He states usually symptoms are milder than this but because they are more severe today he came to the ER.  He states that he has been given IM Benadryl in the past which quickly resolved his symptoms.  Patient was given Benadryl, Pepcid, prednisone in triage and states that he is overall feeling better but still has a mildly inflamed throat.  He denies rash, itching, syncope, abdominal pain, nausea or vomiting  HPI     Past Medical History:  Diagnosis Date  . H. pylori infection     There are no problems to display for this patient.   No past surgical history on file.     Family History  Problem Relation Age of Onset  . Healthy Mother   . Hypertension Father   . Healthy Sister   . Healthy Brother     Social History   Tobacco Use  . Smoking status: Former Smoker    Quit date: 11/05/2018    Years since quitting: 1.0  . Smokeless tobacco: Never Used  Vaping Use  . Vaping Use: Former  . Quit date: 01/05/2019  Substance Use Topics  . Alcohol use: Yes  . Drug use: Not Currently    Types: Marijuana    Home Medications Prior to Admission medications   Medication Sig Start Date End Date Taking? Authorizing Provider  ADDERALL XR 5 MG 24 hr capsule Take 5 mg by mouth every morning. 04/28/19   [provider]  famotidine (PEPCID) 20 MG tablet Take 1 tablet (20 mg total) by mouth 2 (two) times daily for 30 days. 09/28/18 10/28/18  Menshew, Charlesetta Ivory, PA-C  lamoTRIgine  (LAMICTAL) 200 MG tablet Take 200 mg by mouth daily. 03/30/19   [provider]  metoCLOPramide (REGLAN) 5 MG tablet Take 1 tablet (5 mg total) by mouth every 8 (eight) hours as needed for nausea or vomiting. 09/28/18 05/07/19  Menshew, Charlesetta Ivory, PA-C    Allergies    Patient has no known allergies.  Review of Systems   Review of Systems  HENT: Positive for sore throat.   Respiratory: Negative for shortness of breath.   Cardiovascular: Positive for chest pain.  Gastrointestinal: Negative for nausea and vomiting.  Skin: Positive for rash.  Neurological: Negative for syncope and light-headedness.    Physical Exam Updated Vital Signs BP (!) 148/88 (BP Location: Right Arm)   Pulse 90   Resp 16   SpO2 100%   Physical Exam Vitals and nursing note reviewed.  Constitutional:      General: He is not in acute distress.    Appearance: Normal appearance. He is well-developed. He is not ill-appearing.  HENT:     Head: Normocephalic and atraumatic.     Mouth/Throat:     Mouth: Mucous membranes are moist.  Eyes:     General: No scleral icterus.       Right eye: No discharge.  Left eye: No discharge.     Conjunctiva/sclera: Conjunctivae normal.     Pupils: Pupils are equal, round, and reactive to light.  Cardiovascular:     Rate and Rhythm: Normal rate and regular rhythm.  Pulmonary:     Effort: Pulmonary effort is normal. No respiratory distress.     Breath sounds: Normal breath sounds.  Abdominal:     General: There is no distension.  Musculoskeletal:     Cervical back: Normal range of motion.  Skin:    General: Skin is warm and dry.  Neurological:     Mental Status: He is alert and oriented to person, place, and time.  Psychiatric:        Behavior: Behavior normal.     ED Results / Procedures / Treatments   Labs (all labs ordered are listed, but only abnormal results are displayed) Labs Reviewed - No data to display  EKG None  Radiology No results  found.  Procedures Procedures (including critical care time)  Medications Ordered in ED Medications  diphenhydrAMINE (BENADRYL) capsule 25 mg (25 mg Oral Given 12/08/19 1713)  famotidine (PEPCID) tablet 20 mg (20 mg Oral Given 12/08/19 1713)  predniSONE (DELTASONE) tablet 60 mg (60 mg Oral Given 12/08/19 1713)    ED Course  I have reviewed the triage vital signs and the nursing notes.  Pertinent labs & imaging results that were available during my care of the patient were reviewed by me and considered in my medical decision making (see chart for details).  19 year old male presents with acute sore throat and chest pain after eating crab today.  Patient believes he is having an allergic reaction.  It could be a mild allergic reaction vs acid reflux.  No signs of anaphylaxis.  Patient is sleeping on my evaluation and is well-appearing. He was offered GI cocktail for ongoing throat irritation but declines and is asking for discharge. Advised Benadryl and Pepcid as needed and to return if worsening  MDM Rules/Calculators/A&P                           Final Clinical Impression(s) / ED Diagnoses Final diagnoses:  Sore throat    Rx / DC Orders ED Discharge Orders    None       Bethel Born, PA-C 12/08/19 Salvadore Farber, MD 12/08/19 2207

## 2019-12-08 NOTE — Discharge Instructions (Signed)
Take Benadryl and Pepcid as needed for inflammation and pain Please return if you are worsening

## 2019-12-08 NOTE — ED Triage Notes (Signed)
Pt arrives POV and called inside of the ER asking for someone to come get him out of his car- This rn when to car with a wheel chair and pt escorted into ER. Pt states about 30 minutes he ate some crab and now feeling tightness in his throat. Pt is allergic to shrimp and thinks there may have been shrimp near his food. Pt is tearful but speaking in complete sentences, airway intact. No hives or redness noted.

## 2020-02-14 ENCOUNTER — Other Ambulatory Visit: Payer: Self-pay

## 2020-02-14 ENCOUNTER — Encounter (HOSPITAL_COMMUNITY): Payer: Self-pay | Admitting: Emergency Medicine

## 2020-02-14 ENCOUNTER — Ambulatory Visit (HOSPITAL_COMMUNITY)
Admission: EM | Admit: 2020-02-14 | Discharge: 2020-02-14 | Disposition: A | Discharge status: Home / Self Care | Payer: BC Managed Care – PPO | Attending: Family Medicine | Admitting: Family Medicine

## 2020-02-14 DIAGNOSIS — J029 Acute pharyngitis, unspecified: Secondary | ICD-10-CM | POA: Diagnosis present

## 2020-02-14 DIAGNOSIS — R509 Fever, unspecified: Secondary | ICD-10-CM | POA: Diagnosis not present

## 2020-02-14 LAB — POCT RAPID STREP A, ED / UC: Streptococcus, Group A Screen (Direct): NEGATIVE

## 2020-02-14 NOTE — ED Triage Notes (Signed)
Pt presents with sore throat, fever, nasal congestion that started last night. States tested positive for COVID 3 weeks ago.

## 2020-02-14 NOTE — ED Provider Notes (Signed)
[facility]    CSN: [id number] Arrival date & time: 02/14/20  9480      History   Chief Complaint Chief Complaint  Patient presents with  . Fever  . Sore Throat    HPI Tim Arnold is a 19 y.o. male.   Patient is a 19 year old male who presents today for fever, sore throat, nasal ingestion started last night.  Symptoms have been constant.  Reporting pain with swallowing but no trouble swallowing or breathing.  No known sick contacts.  Tested positive for Covid 3 weeks ago but recovered from this.  Does have history of strep.     Past Medical History:  Diagnosis Date  . H. pylori infection     There are no problems to display for this patient.   History reviewed. No pertinent surgical history.     Home Medications    Prior to Admission medications   Medication Sig Start Date End Date Taking? Authorizing Provider  amphetamine-dextroamphetamine (ADDERALL XR) 20 MG 24 hr capsule Take 20 mg by mouth daily.   Yes [provider]  ADDERALL XR 5 MG 24 hr capsule Take 5 mg by mouth every morning. 04/28/19   [provider]  famotidine (PEPCID) 20 MG tablet Take 1 tablet (20 mg total) by mouth 2 (two) times daily for 30 days. 09/28/18 10/28/18  Menshew, Charlesetta Ivory, PA-C  lamoTRIgine (LAMICTAL) 200 MG tablet Take 200 mg by mouth daily. 03/30/19   [provider]  metoCLOPramide (REGLAN) 5 MG tablet Take 1 tablet (5 mg total) by mouth every 8 (eight) hours as needed for nausea or vomiting. 09/28/18 05/07/19  Menshew, Charlesetta Ivory, PA-C    Family History Family History  Problem Relation Age of Onset  . Healthy Mother   . Hypertension Father   . Healthy Sister   . Healthy Brother     Social History Social History   Tobacco Use  . Smoking status: Former Smoker    Quit date: 11/05/2018    Years since quitting: 1.2  . Smokeless tobacco: Never Used  Vaping Use  . Vaping Use: Former  . Quit date: 01/05/2019  Substance Use Topics    . Alcohol use: Yes  . Drug use: Not Currently    Types: Marijuana     Allergies   Patient has no known allergies.   Review of Systems Review of Systems   Physical Exam Triage Vital Signs ED Triage Vitals  Enc Vitals Group     BP 02/14/20 1039 124/77     Pulse Rate 02/14/20 1039 78     Resp 02/14/20 1039 16     Temp 02/14/20 1039 98.2 F (36.8 C)     Temp Source 02/14/20 1039 Oral     SpO2 02/14/20 1039 99 %     Weight --      Height --      Head Circumference --      Peak Flow --      Pain Score 02/14/20 1038 5     Pain Loc --      Pain Edu? --      Excl. in [place]? --    No data found.  Updated Vital Signs BP 124/77 (BP Location: Right Arm)   Pulse 78   Temp 98.2 F (36.8 C) (Oral)   Resp 16   SpO2 99%   Visual Acuity Right Eye Distance:   Left Eye Distance:   Bilateral Distance:    Right Eye  Near:   Left Eye Near:    Bilateral Near:     Physical Exam Vitals and nursing note reviewed.  Constitutional:      Appearance: Normal appearance.  HENT:     Head: Normocephalic and atraumatic.     Nose: Nose normal.     Mouth/Throat:     Pharynx: Uvula midline. Posterior oropharyngeal erythema present.     Tonsils: 0 on the right. 0 on the left.  Eyes:     Conjunctiva/sclera: Conjunctivae normal.  Pulmonary:     Effort: Pulmonary effort is normal.  Musculoskeletal:        General: Normal range of motion.     Cervical back: Normal range of motion.  Skin:    General: Skin is warm and dry.  Neurological:     Mental Status: He is alert.  Psychiatric:        Mood and Affect: Mood normal.      UC Treatments / Results  Labs (all labs ordered are listed, but only abnormal results are displayed) Labs Reviewed  CULTURE, GROUP A STREP [facility])  POCT RAPID STREP A, ED / UC    EKG   Radiology No results found.  Procedures Procedures (including critical care time)  Medications Ordered in UC Medications - No data to display  Initial Impression /  Assessment and Plan / UC Course  I have reviewed the triage vital signs and the nursing notes.  Pertinent labs & imaging results that were available during my care of the patient were reviewed by me and considered in my medical decision making (see chart for details).     Sore throat Rapid strep test negative.  Sending for culture.  This could be viral or allergy related.  Zyrtec daily and warm salt water gargles. Follow up as needed for continued or worsening symptoms  Final Clinical Impressions(s) / UC Diagnoses   Final diagnoses:  Sore throat     Discharge Instructions     Your strep test was negative This could be viral or allergy related You can try doing zyrtec daily and warm salt water gargles, warm tea with honey.  We will send the throat swab for culture and call if positive.  Follow up as needed for continued or worsening symptoms]      ED Prescriptions    None     PDMP not reviewed this encounter.   Dahlia Byes A, NP 02/14/20 1150

## 2020-02-14 NOTE — Discharge Instructions (Addendum)
Your strep test was negative This could be viral or allergy related You can try doing zyrtec daily and warm salt water gargles, warm tea with honey.  We will send the throat swab for culture and call if positive.  Follow up as needed for continued or worsening symptoms]

## 2020-02-16 LAB — CULTURE, GROUP A STREP (THRC)

## 2020-02-22 ENCOUNTER — Encounter (HOSPITAL_COMMUNITY): Payer: Self-pay

## 2020-02-22 ENCOUNTER — Ambulatory Visit (HOSPITAL_COMMUNITY)
Admission: RE | Admit: 2020-02-22 | Discharge: 2020-02-22 | Disposition: A | Discharge status: Home / Self Care | Payer: BC Managed Care – PPO | Source: Ambulatory Visit | Attending: Urgent Care | Admitting: Urgent Care

## 2020-02-22 ENCOUNTER — Other Ambulatory Visit: Payer: Self-pay

## 2020-02-22 VITALS — BP 131/65 | HR 101 | Temp 97.7°F | Resp 19

## 2020-02-22 DIAGNOSIS — J019 Acute sinusitis, unspecified: Secondary | ICD-10-CM

## 2020-02-22 DIAGNOSIS — J3489 Other specified disorders of nose and nasal sinuses: Secondary | ICD-10-CM

## 2020-02-22 DIAGNOSIS — R059 Cough, unspecified: Secondary | ICD-10-CM

## 2020-02-22 DIAGNOSIS — J029 Acute pharyngitis, unspecified: Secondary | ICD-10-CM

## 2020-02-22 MED ORDER — CETIRIZINE HCL 10 MG PO TABS: 10.0000 mg | ORAL_TABLET | Freq: Every day | ORAL | 0 refills | Status: AC

## 2020-02-22 MED ORDER — AMOXICILLIN 875 MG PO TABS: 875.0000 mg | ORAL_TABLET | Freq: Two times a day (BID) | ORAL | 0 refills | Status: DC

## 2020-02-22 NOTE — ED Provider Notes (Signed)
[facility]   MRN: [id number] DOB: [birth year]  Subjective:   Tim Arnold is a 19 y.o. male presenting for 1+ week hx of persistent throat pain, intermittent cough, raspy voice. Has taken Sudafed, DayQuil. Had negative strep 02/14/2020. Productive cough has had intermittent streaks of blood.   No current facility-administered medications for this encounter.  Current Outpatient Medications:    ADDERALL XR 5 MG 24 hr capsule, Take 5 mg by mouth every morning., Disp: , Rfl:    amphetamine-dextroamphetamine (ADDERALL XR) 20 MG 24 hr capsule, Take 20 mg by mouth daily., Disp: , Rfl:    famotidine (PEPCID) 20 MG tablet, Take 1 tablet (20 mg total) by mouth 2 (two) times daily for 30 days., Disp: 60 tablet, Rfl: 0   lamoTRIgine (LAMICTAL) 200 MG tablet, Take 200 mg by mouth daily., Disp: , Rfl:    No Known Allergies  Past Medical History:  Diagnosis Date   H. pylori infection      History reviewed. No pertinent surgical history.  Family History  Problem Relation Age of Onset   Healthy Mother    Hypertension Father    Healthy Sister    Healthy Brother     Social History   Tobacco Use   Smoking status: Former Smoker    Quit date: 11/05/2018    Years since quitting: 1.2   Smokeless tobacco: Never Used  Vaping Use   Vaping Use: Former   Quit date: 01/05/2019  Substance Use Topics   Alcohol use: Yes   Drug use: Not Currently    Types: Marijuana    ROS   Objective:   Vitals: BP 131/65    Pulse (!) 101    Temp 97.7 F (36.5 C)    Resp 19    SpO2 100%   Physical Exam Constitutional:      General: He is not in acute distress.    Appearance: Normal appearance. He is well-developed and normal weight. He is not ill-appearing, toxic-appearing or diaphoretic.  HENT:     Head: Normocephalic and atraumatic.     Right Ear: Tympanic membrane, ear canal and external ear normal. There is no impacted cerumen.     Left Ear: Tympanic membrane,  ear canal and external ear normal. There is no impacted cerumen.     Nose: Nose normal. No congestion or rhinorrhea.     Mouth/Throat:     Mouth: Mucous membranes are moist.     Pharynx: No oropharyngeal exudate or posterior oropharyngeal erythema.     Comments: Significant postnasal drainage and cobblestone pattern overlying pharynx. Eyes:     General: No scleral icterus.       Right eye: No discharge.        Left eye: No discharge.     Extraocular Movements: Extraocular movements intact.     Conjunctiva/sclera: Conjunctivae normal.     Pupils: Pupils are equal, round, and reactive to light.  Cardiovascular:     Rate and Rhythm: Normal rate and regular rhythm.     Heart sounds: Normal heart sounds. No murmur heard.  No friction rub. No gallop.   Pulmonary:     Effort: Pulmonary effort is normal. No respiratory distress.     Breath sounds: Normal breath sounds. No stridor. No wheezing, rhonchi or rales.  Musculoskeletal:     Cervical back: Normal range of motion and neck supple. No rigidity. No muscular tenderness.  Neurological:     General: No focal deficit present.  Mental Status: He is alert and oriented to person, place, and time.  Psychiatric:        Mood and Affect: Mood normal.        Behavior: Behavior normal.        Thought Content: Thought content normal.      Assessment and Plan :   PDMP not reviewed this encounter.  1. Acute non-recurrent sinusitis, unspecified location   2. Sore throat   3. Cough   4. Stuffy and runny nose     Will start empiric treatment for sinusitis with amoxicillin.  Recommended supportive care otherwise including the use of oral antihistamine, decongestant. Counseled patient on potential for adverse effects with medications prescribed/recommended today, ER and return-to-clinic precautions discussed, patient verbalized understanding.    Wallis Bamberg, [place] 02/22/20 1931

## 2020-02-22 NOTE — ED Triage Notes (Addendum)
Pt presents with complaints of continued sore throat. States he was here a week ago and tested for strep and it was negative. Patient is feeling worse and drainage as well. Pt had covid 1 month ago.

## 2020-07-12 ENCOUNTER — Other Ambulatory Visit: Payer: Self-pay

## 2020-07-12 ENCOUNTER — Encounter (HOSPITAL_COMMUNITY): Payer: Self-pay | Admitting: Emergency Medicine

## 2020-07-12 ENCOUNTER — Ambulatory Visit (HOSPITAL_COMMUNITY)
Admission: EM | Admit: 2020-07-12 | Discharge: 2020-07-12 | Disposition: A | Discharge status: Home / Self Care | Payer: BC Managed Care – PPO | Attending: Medical Oncology | Admitting: Medical Oncology

## 2020-07-12 DIAGNOSIS — M940 Chondrocostal junction syndrome [Tietze]: Secondary | ICD-10-CM

## 2020-07-12 DIAGNOSIS — R079 Chest pain, unspecified: Secondary | ICD-10-CM | POA: Diagnosis not present

## 2020-07-12 MED ORDER — IBUPROFEN 800 MG PO TABS: 800.0000 mg | ORAL_TABLET | Freq: Three times a day (TID) | ORAL | 0 refills | Status: DC

## 2020-07-12 NOTE — ED Triage Notes (Signed)
Pt presents with chest pain or tightness xs 3 days. Describes the pain as a tightness. States also has some nasal congestion.

## 2020-07-12 NOTE — ED Provider Notes (Signed)
[facility]    CSN: [id number] Arrival date & time: 07/12/20  1709      History   Chief Complaint Chief Complaint  Patient presents with  . Chest Pain    HPI Tim Arnold is a 20 y.o. male.   HPI   Chest Pain: Patient reports that for the past 3 days he has had some chest pain and tightness.  Symptoms occur mainly at rest or if he takes a deep breath in.  He denies any injury, shortness of breath, dizziness, cough or fever.  He has not had similar symptoms in the past.  He has not taken anything for symptoms.  No recent illness.  No recent history of Covid at the last month.   Past Medical History:  Diagnosis Date  . H. pylori infection     There are no problems to display for this patient.   History reviewed. No pertinent surgical history.     Home Medications    Prior to Admission medications   Medication Sig Start Date End Date Taking? Authorizing Provider  ADDERALL XR 5 MG 24 hr capsule Take 5 mg by mouth every morning. 04/28/19   [provider]  amoxicillin (AMOXIL) 875 MG tablet Take 1 tablet (875 mg total) by mouth 2 (two) times daily. 02/22/20   Wallis Bamberg, PA-C  amphetamine-dextroamphetamine (ADDERALL XR) 20 MG 24 hr capsule Take 20 mg by mouth daily.    [provider]  cetirizine (ZYRTEC ALLERGY) 10 MG tablet Take 1 tablet (10 mg total) by mouth daily. 02/22/20   Wallis Bamberg, PA-C  famotidine (PEPCID) 20 MG tablet Take 1 tablet (20 mg total) by mouth 2 (two) times daily for 30 days. 09/28/18 10/28/18  Menshew, Charlesetta Ivory, PA-C  lamoTRIgine (LAMICTAL) 200 MG tablet Take 200 mg by mouth daily. 03/30/19   [provider]  metoCLOPramide (REGLAN) 5 MG tablet Take 1 tablet (5 mg total) by mouth every 8 (eight) hours as needed for nausea or vomiting. 09/28/18 05/07/19  Menshew, Charlesetta Ivory, PA-C    Family History Family History  Problem Relation Age of Onset  . Healthy Mother   . Hypertension Father   . Healthy  Sister   . Healthy Brother     Social History Social History   Tobacco Use  . Smoking status: Former Smoker    Quit date: 11/05/2018    Years since quitting: 1.6  . Smokeless tobacco: Never Used  Vaping Use  . Vaping Use: Former  . Quit date: 01/05/2019  Substance Use Topics  . Alcohol use: Yes  . Drug use: Not Currently    Types: Marijuana     Allergies   Patient has no known allergies.   Review of Systems Review of Systems  As stated above in HPI  Physical Exam Triage Vital Signs ED Triage Vitals  Enc Vitals Group     BP 07/12/20 1817 131/76     Pulse Rate 07/12/20 1817 90     Resp 07/12/20 1817 18     Temp 07/12/20 1817 98.3 F (36.8 C)     Temp Source 07/12/20 1817 Oral     SpO2 07/12/20 1817 100 %     Weight --      Height --      Head Circumference --      Peak Flow --      Pain Score 07/12/20 1816 5     Pain Loc --      Pain  Edu? --      Excl. in GC? --    No data found.  Updated Vital Signs BP 131/76 (BP Location: Right Arm)   Pulse 90   Temp 98.3 F (36.8 C) (Oral)   Resp 18   SpO2 100%   Visual Acuity Right Eye Distance:   Left Eye Distance:   Bilateral Distance:    Right Eye Near:   Left Eye Near:    Bilateral Near:     Physical Exam Vitals and nursing note reviewed.  Constitutional:      General: He is not in acute distress.    Appearance: He is not ill-appearing, toxic-appearing or diaphoretic.  HENT:     Head: Normocephalic.  Eyes:     Comments: Negative for pallor  Cardiovascular:     Rate and Rhythm: Normal rate and regular rhythm.     Heart sounds: Normal heart sounds.  Pulmonary:     Effort: Pulmonary effort is normal. No tachypnea, accessory muscle usage or respiratory distress.     Breath sounds: Normal breath sounds. No stridor. No decreased breath sounds, wheezing, rhonchi or rales.  Chest:     Chest wall: Tenderness (reproducible tenderness of the left superior chest to palpation without evidence of edema or  bony abnormality ) present. No mass, deformity, crepitus or edema.  Skin:    Coloration: Skin is not cyanotic or pale.     Nails: There is no clubbing.  Neurological:     General: No focal deficit present.     Mental Status: He is alert and oriented to person, place, and time.     Motor: No weakness.      UC Treatments / Results  Labs (all labs ordered are listed, but only abnormal results are displayed) Labs Reviewed - No data to display  EKG   Radiology No results found.  Procedures Procedures (including critical care time)  Medications Ordered in UC Medications - No data to display  Initial Impression / Assessment and Plan / UC Course  I have reviewed the triage vital signs and the nursing notes.  Pertinent labs & imaging results that were available during my care of the patient were reviewed by me and considered in my medical decision making (see chart for details).     New.  Appears to be costochondritis given history and reproducible tenderness which we discussed.  Sending in ibuprofen for him to use over the next 7 days with food.  We discussed activities to avoid.  Red flag symptoms discussed as well. Final Clinical Impressions(s) / UC Diagnoses   Final diagnoses:  None   Discharge Instructions   None    ED Prescriptions    None     PDMP not reviewed this encounter.   Rushie Chestnut, Cordelia Poche 07/12/20 1904

## 2020-08-25 ENCOUNTER — Other Ambulatory Visit: Payer: Self-pay

## 2020-08-25 ENCOUNTER — Encounter (HOSPITAL_BASED_OUTPATIENT_CLINIC_OR_DEPARTMENT_OTHER): Payer: Self-pay

## 2020-08-25 ENCOUNTER — Emergency Department (HOSPITAL_BASED_OUTPATIENT_CLINIC_OR_DEPARTMENT_OTHER): Payer: BC Managed Care – PPO

## 2020-08-25 ENCOUNTER — Emergency Department (HOSPITAL_BASED_OUTPATIENT_CLINIC_OR_DEPARTMENT_OTHER)
Admission: EM | Admit: 2020-08-25 | Discharge: 2020-08-25 | Disposition: A | Discharge status: Home / Self Care | Payer: BC Managed Care – PPO | Attending: Emergency Medicine | Admitting: Emergency Medicine

## 2020-08-25 DIAGNOSIS — Z20822 Contact with and (suspected) exposure to covid-19: Secondary | ICD-10-CM | POA: Diagnosis not present

## 2020-08-25 DIAGNOSIS — R112 Nausea with vomiting, unspecified: Secondary | ICD-10-CM | POA: Diagnosis not present

## 2020-08-25 DIAGNOSIS — R1012 Left upper quadrant pain: Secondary | ICD-10-CM | POA: Insufficient documentation

## 2020-08-25 DIAGNOSIS — R519 Headache, unspecified: Secondary | ICD-10-CM | POA: Insufficient documentation

## 2020-08-25 DIAGNOSIS — Z87891 Personal history of nicotine dependence: Secondary | ICD-10-CM | POA: Insufficient documentation

## 2020-08-25 DIAGNOSIS — R1032 Left lower quadrant pain: Secondary | ICD-10-CM | POA: Insufficient documentation

## 2020-08-25 DIAGNOSIS — R1084 Generalized abdominal pain: Secondary | ICD-10-CM

## 2020-08-25 LAB — CBC WITH DIFFERENTIAL/PLATELET
Abs Immature Granulocytes: 0.04 10*3/uL (ref 0.00–0.07)
Basophils Absolute: 0 10*3/uL (ref 0.0–0.1)
Basophils Relative: 0 %
Eosinophils Absolute: 0.2 10*3/uL (ref 0.0–0.5)
Eosinophils Relative: 2 %
HCT: 47.7 % (ref 39.0–52.0)
Hemoglobin: 17.1 g/dL — ABNORMAL HIGH (ref 13.0–17.0)
Immature Granulocytes: 0 %
Lymphocytes Relative: 7 %
Lymphs Abs: 0.7 10*3/uL (ref 0.7–4.0)
MCH: 31.6 pg (ref 26.0–34.0)
MCHC: 35.8 g/dL (ref 30.0–36.0)
MCV: 88.2 fL (ref 80.0–100.0)
Monocytes Absolute: 0.8 10*3/uL (ref 0.1–1.0)
Monocytes Relative: 8 %
Neutro Abs: 8.2 10*3/uL — ABNORMAL HIGH (ref 1.7–7.7)
Neutrophils Relative %: 83 %
Platelets: 216 10*3/uL (ref 150–400)
RBC: 5.41 MIL/uL (ref 4.22–5.81)
RDW: 11.9 % (ref 11.5–15.5)
WBC: 10 10*3/uL (ref 4.0–10.5)
nRBC: 0 % (ref 0.0–0.2)

## 2020-08-25 LAB — RAPID URINE DRUG SCREEN, HOSP PERFORMED
Amphetamines: NOT DETECTED
Barbiturates: NOT DETECTED
Benzodiazepines: NOT DETECTED
Cocaine: NOT DETECTED
Opiates: NOT DETECTED
Tetrahydrocannabinol: NOT DETECTED

## 2020-08-25 LAB — RESP PANEL BY RT-PCR (FLU A&B, COVID) ARPGX2
Influenza A by PCR: NEGATIVE
Influenza B by PCR: NEGATIVE
SARS Coronavirus 2 by RT PCR: NEGATIVE

## 2020-08-25 LAB — URINALYSIS, ROUTINE W REFLEX MICROSCOPIC
Bilirubin Urine: NEGATIVE
Glucose, UA: NEGATIVE mg/dL
Hgb urine dipstick: NEGATIVE
Ketones, ur: 40 mg/dL — AB
Leukocytes,Ua: NEGATIVE
Nitrite: NEGATIVE
Protein, ur: 30 mg/dL — AB
pH: 6.5 (ref 5.0–8.0)

## 2020-08-25 LAB — COMPREHENSIVE METABOLIC PANEL
ALT: 5 U/L (ref 0–44)
AST: 15 U/L (ref 15–41)
Albumin: 5.1 g/dL — ABNORMAL HIGH (ref 3.5–5.0)
Alkaline Phosphatase: 73 U/L (ref 38–126)
Anion gap: 12 (ref 5–15)
BUN: 15 mg/dL (ref 6–20)
CO2: 25 mmol/L (ref 22–32)
Calcium: 10.2 mg/dL (ref 8.9–10.3)
Chloride: 102 mmol/L (ref 98–111)
Creatinine, Ser: 1.31 mg/dL — ABNORMAL HIGH (ref 0.61–1.24)
GFR, Estimated: >60 mL/min (ref >60)
Glucose, Bld: 145 mg/dL — ABNORMAL HIGH (ref 70–99)
Potassium: 3.9 mmol/L (ref 3.5–5.1)
Sodium: 139 mmol/L (ref 135–145)
Total Bilirubin: 1.4 mg/dL — ABNORMAL HIGH (ref 0.3–1.2)
Total Protein: 7.7 g/dL (ref 6.5–8.1)

## 2020-08-25 LAB — LIPASE, BLOOD: Lipase: 22 U/L (ref 11–51)

## 2020-08-25 MED ORDER — DIPHENHYDRAMINE HCL 50 MG/ML IJ SOLN
12.5000 mg | Freq: Once | INTRAMUSCULAR | Status: AC
  Administered 2020-08-25: 12.5 mg via INTRAVENOUS
  Filled 2020-08-25: qty 1

## 2020-08-25 MED ORDER — ONDANSETRON 4 MG PO TBDP: 4.0000 mg | ORAL_TABLET | Freq: Three times a day (TID) | ORAL | 0 refills | Status: DC | PRN

## 2020-08-25 MED ORDER — IOHEXOL 300 MG/ML  SOLN
100.0000 mL | Freq: Once | INTRAMUSCULAR | Status: AC | PRN
  Administered 2020-08-25: 100 mL via INTRAVENOUS

## 2020-08-25 MED ORDER — PROCHLORPERAZINE EDISYLATE 10 MG/2ML IJ SOLN
10.0000 mg | Freq: Once | INTRAMUSCULAR | Status: AC
  Administered 2020-08-25: 10 mg via INTRAVENOUS
  Filled 2020-08-25: qty 2

## 2020-08-25 MED ORDER — SODIUM CHLORIDE 0.9 % IV BOLUS
1000.0000 mL | Freq: Once | INTRAVENOUS | Status: AC
  Administered 2020-08-25: 1000 mL via INTRAVENOUS

## 2020-08-25 NOTE — ED Provider Notes (Signed)
MSE was initiated and I personally evaluated the patient and placed orders (if any) at  8:12 PM on August 25, 2020.  The patient appears stable so that the remainder of the MSE may be completed by another provider.   Milagros Loll, MD 08/25/20 2012

## 2020-08-25 NOTE — ED Notes (Signed)
Pt given two blankets and some crackers. Instructed use of call light to obtain UA. Fluids infusing.

## 2020-08-25 NOTE — ED Triage Notes (Signed)
Pt came  In  For HA and vomiting 3x times today - pt has been vomiting in triage  - also c/o of abd pain     had motrin at 1730 today  -  Pain 7/10

## 2020-08-25 NOTE — Discharge Instructions (Signed)
Take Tylenol or Motrin for pain control.  Take Zofran as needed for nausea.  If you have any significant worsening of your pain, worsening vomiting, any fever, neck stiffness or other new concerning symptom, return immediately to ER for reassessment.  Recommend recheck with your primary doctor next week.

## 2020-08-26 NOTE — ED Provider Notes (Signed)
[facility] EMERGENCY DEPT Provider Note   CSN: [id number] Arrival date & time: 08/25/20  1945     History Chief Complaint  Patient presents with  . Headache  . Emesis    Tim Arnold is a 20 y.o. male.  Presents to ER with concern for headache, abdominal pain, vomiting.  Patient states that yesterday he was in his normal state of health.  This morning he woke up noted frontal headache.  Throughout the day he also developed nausea and occasional episode of vomiting and some intermittent abdominal pain.  Headache was not sudden onset, not worst headache of his life though he does not regularly get headaches.  No neck pain or neck stiffness.  Pain is currently moderate.  Pain in his abdomen was worse with vomiting, primarily left-sided.  Currently mild.  Total around 3 episodes of vomiting, nonbloody nonbilious.  Currently feels somewhat nauseated.  No fevers.  No known sick contacts.  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  HPI     Past Medical History:  Diagnosis Date  . H. pylori infection     There are no problems to display for this patient.   History reviewed. No pertinent surgical history.     Family History  Problem Relation Age of Onset  . Healthy Mother   . Hypertension Father   . Healthy Sister   . Healthy Brother     Social History   Tobacco Use  . Smoking status: Former Smoker    Quit date: 11/05/2018    Years since quitting: 1.8  . Smokeless tobacco: Never Used  Vaping Use  . Vaping Use: Former  . Quit date: 01/05/2019  Substance Use Topics  . Alcohol use: Yes  . Drug use: Not Currently    Types: Marijuana    Home Medications Prior to Admission medications   Medication Sig Start Date End Date Taking? Authorizing Provider  ondansetron (ZOFRAN ODT) 4 MG disintegrating tablet Take 1 tablet (4 mg total) by mouth every 8 (eight) hours as needed for nausea or vomiting. 08/25/20  Yes Brevan Luberto, Quitman Livings, MD  ADDERALL XR 5 MG 24 hr capsule  Take 5 mg by mouth every morning. 04/28/19   [provider]  amoxicillin (AMOXIL) 875 MG tablet Take 1 tablet (875 mg total) by mouth 2 (two) times daily. 02/22/20   Wallis Bamberg, PA-C  amphetamine-dextroamphetamine (ADDERALL XR) 20 MG 24 hr capsule Take 20 mg by mouth daily.    [provider]  cetirizine (ZYRTEC ALLERGY) 10 MG tablet Take 1 tablet (10 mg total) by mouth daily. 02/22/20   Wallis Bamberg, PA-C  famotidine (PEPCID) 20 MG tablet Take 1 tablet (20 mg total) by mouth 2 (two) times daily for 30 days. 09/28/18 10/28/18  Menshew, Charlesetta Ivory, PA-C  ibuprofen (ADVIL) 800 MG tablet Take 1 tablet (800 mg total) by mouth 3 (three) times daily. 07/12/20   Rushie Chestnut, PA-C  lamoTRIgine (LAMICTAL) 200 MG tablet Take 200 mg by mouth daily. 03/30/19   [provider]  metoCLOPramide (REGLAN) 5 MG tablet Take 1 tablet (5 mg total) by mouth every 8 (eight) hours as needed for nausea or vomiting. 09/28/18 05/07/19  Menshew, Charlesetta Ivory, PA-C    Allergies    Patient has no known allergies.  Review of Systems   Review of Systems  Constitutional: Negative for chills and fever.  HENT: Negative for ear pain and sore throat.   Eyes: Negative for pain and visual disturbance.  Respiratory: Negative for cough and shortness of breath.   Cardiovascular: Negative for chest pain and palpitations.  Gastrointestinal: Positive for abdominal pain, nausea and vomiting.  Genitourinary: Negative for dysuria and hematuria.  Musculoskeletal: Negative for arthralgias and back pain.  Skin: Negative for color change and rash.  Neurological: Positive for headaches. Negative for seizures and syncope.  All other systems reviewed and are negative.   Physical Exam Updated Vital Signs BP 133/81   Pulse 86   Temp 97.7 F (36.5 C)   Resp 16   Ht 6\' 4"  (1.93 m)   Wt 74.4 kg   SpO2 100%   BMI 19.96 kg/m   Physical Exam Vitals and nursing note reviewed.  Constitutional:       Appearance: He is well-developed.  HENT:     Head: Normocephalic and atraumatic.  Eyes:     General: No visual field deficit.    Conjunctiva/sclera: Conjunctivae normal.  Neck:     Comments: Normal neck range of motion Cardiovascular:     Rate and Rhythm: Normal rate and regular rhythm.     Heart sounds: No murmur heard.   Pulmonary:     Effort: Pulmonary effort is normal. No respiratory distress.     Breath sounds: Normal breath sounds.  Abdominal:     Palpations: Abdomen is soft.     Tenderness: There is no abdominal tenderness.     Comments: Mild tenderness in the left upper quadrant and left lower quadrant  Musculoskeletal:     Cervical back: Neck supple.  Skin:    General: Skin is warm and dry.  Neurological:     Mental Status: He is alert and oriented to person, place, and time.     GCS: GCS eye subscore is 4. GCS verbal subscore is 5. GCS motor subscore is 6.     Cranial Nerves: No cranial nerve deficit, dysarthria or facial asymmetry.     Motor: No weakness.     Coordination: Coordination normal.     Gait: Gait normal.  Psychiatric:        Mood and Affect: Mood normal.     ED Results / Procedures / Treatments   Labs (all labs ordered are listed, but only abnormal results are displayed) Labs Reviewed  CBC WITH DIFFERENTIAL/PLATELET - Abnormal; Notable for the following components:      Result Value   Hemoglobin 17.1 (*)    Neutro Abs 8.2 (*)    All other components within normal limits  COMPREHENSIVE METABOLIC PANEL - Abnormal; Notable for the following components:   Glucose, Bld 145 (*)    Creatinine, Ser 1.31 (*)    Albumin 5.1 (*)    Total Bilirubin 1.4 (*)    All other components within normal limits  URINALYSIS, ROUTINE W REFLEX MICROSCOPIC - Abnormal; Notable for the following components:   Ketones, ur 40 (*)    Protein, ur 30 (*)    All other components within normal limits  RESP PANEL BY RT-PCR (FLU A&B, COVID) ARPGX2  LIPASE, BLOOD  RAPID URINE  DRUG SCREEN, HOSP PERFORMED    EKG None  Radiology CT Head Wo Contrast  Result Date: 08/25/2020 CLINICAL DATA:  Headache vomiting EXAM: CT HEAD WITHOUT CONTRAST TECHNIQUE: Contiguous axial images were obtained from the base of the skull through the vertex without intravenous contrast. COMPARISON:  None. FINDINGS: Brain: No evidence of acute infarction, hemorrhage, hydrocephalus, extra-axial collection or mass lesion/mass effect. Vascular: No hyperdense vessel or unexpected calcification. Skull: Normal. Negative for  fracture or focal lesion. Sinuses/Orbits: No acute finding. Other: None IMPRESSION: Negative non contrasted CT appearance of the brain. Electronically Signed   By: Jasmine PangKim  Fujinaga M.D.   On: 08/25/2020 21:18   CT ABDOMEN PELVIS W CONTRAST  Result Date: 08/25/2020 CLINICAL DATA:  Abdomen pain nausea EXAM: CT ABDOMEN AND PELVIS WITH CONTRAST TECHNIQUE: Multidetector CT imaging of the abdomen and pelvis was performed using the standard protocol following bolus administration of intravenous contrast. CONTRAST:  100mL OMNIPAQUE IOHEXOL 300 MG/ML  SOLN COMPARISON:  CT 06/11/2018 FINDINGS: Lower chest: No acute abnormality. Hepatobiliary: No focal liver abnormality is seen. No gallstones, gallbladder wall thickening, or biliary dilatation. Pancreas: Unremarkable. No pancreatic ductal dilatation or surrounding inflammatory changes. Spleen: Normal in size without focal abnormality. Adrenals/Urinary Tract: Adrenal glands are unremarkable. Kidneys are normal, without renal calculi, focal lesion, or hydronephrosis. Bladder is unremarkable. Stomach/Bowel: Stomach is within normal limits. Appendix appears normal. No evidence of bowel wall thickening, distention, or inflammatory changes. Vascular/Lymphatic: No significant vascular findings are present. No enlarged abdominal or pelvic lymph nodes. Reproductive: Prostate is unremarkable. Other: No abdominal wall hernia or abnormality. No abdominopelvic ascites.  Musculoskeletal: No acute or significant osseous findings. IMPRESSION: Negative. No CT evidence for acute intra-abdominal or pelvic abnormality. Electronically Signed   By: Jasmine PangKim  Fujinaga M.D.   On: 08/25/2020 21:22    Procedures Procedures   Medications Ordered in ED Medications  prochlorperazine (COMPAZINE) injection 10 mg (10 mg Intravenous Given 08/25/20 2041)  diphenhydrAMINE (BENADRYL) injection 12.5 mg (12.5 mg Intravenous Given 08/25/20 2041)  sodium chloride 0.9 % bolus 1,000 mL (0 mLs Intravenous Stopped 08/25/20 2217)  iohexol (OMNIPAQUE) 300 MG/ML solution 100 mL (100 mLs Intravenous Contrast Given 08/25/20 2104)    ED Course  I have reviewed the triage vital signs and the nursing notes.  Pertinent labs & imaging results that were available during my care of the patient were reviewed by me and considered in my medical decision making (see chart for details).    MDM Rules/Calculators/A&P                          20 year old male presented to ER with concern for headache, abdominal pain, vomiting.  On physical exam, patient initially appeared somewhat uncomfortable due to his pain but in no acute distress and had normal vital signs.  Given he does not routinely get headaches, check CT head.  This was negative for acute process.  Given the abdominal pain and tenderness noted on exam, check CT which was negative for acute process.  All of his basic labs were stable.  No leukocytosis, no fever, normal neck exam, doubt CNS infection or occult SAH.  Suspect most likely complex migraine versus acute viral process.  On reassessment, patient symptoms have completely resolved, he was ambulating in department without any difficulty and appears quite well.  Given his stable appearance and stable vital signs, believe he is stable for discharge.  Recommend he recheck with his primary doctor next week.  Reviewed return precautions and discharged home.   After the discussed management above, the patient  was determined to be safe for discharge.  The patient was in agreement with this plan and all questions regarding their care were answered.  ED return precautions were discussed and the patient will return to the ED with any significant worsening of condition.   Final Clinical Impression(s) / ED Diagnoses Final diagnoses:  Acute nonintractable headache, unspecified headache type  Generalized abdominal pain  Rx / DC Orders ED Discharge Orders         Ordered    ondansetron (ZOFRAN ODT) 4 MG disintegrating tablet  Every 8 hours PRN        08/25/20 2251           Milagros Loll, MD 08/26/20 1451

## 2021-02-22 ENCOUNTER — Ambulatory Visit (HOSPITAL_COMMUNITY)
Admission: EM | Admit: 2021-02-22 | Discharge: 2021-02-22 | Disposition: A | Discharge status: Home / Self Care | Payer: BC Managed Care – PPO | Attending: Physician Assistant | Admitting: Physician Assistant

## 2021-02-22 ENCOUNTER — Other Ambulatory Visit: Payer: Self-pay

## 2021-02-22 ENCOUNTER — Encounter (HOSPITAL_COMMUNITY): Payer: Self-pay

## 2021-02-22 DIAGNOSIS — J0140 Acute pansinusitis, unspecified: Secondary | ICD-10-CM | POA: Diagnosis not present

## 2021-02-22 MED ORDER — FLUTICASONE PROPIONATE 50 MCG/ACT NA SUSP: 1.0000 | Freq: Every day | NASAL | 0 refills | Status: AC

## 2021-02-22 MED ORDER — AMOXICILLIN-POT CLAVULANATE 875-125 MG PO TABS: 1.0000 | ORAL_TABLET | Freq: Two times a day (BID) | ORAL | 0 refills | Status: AC

## 2021-02-22 NOTE — ED Provider Notes (Signed)
[facility]    CSN: [id number] Arrival date & time: 02/22/21  6160      History   Chief Complaint Chief Complaint  Patient presents with   Sore Throat   Nasal Congestion    HPI Tim Arnold is a 20 y.o. male.   Patient presents today with a week plus history of sinus symptoms.  Reports sinus pressure, nasal congestion, headache.  Denies any fever, cough, shortness of breath, body aches, nausea, vomiting.  He has tried Sudafed and antihistamines without improvement of symptoms.  He is up-to-date on COVID-19 vaccination had COVID approximately 2 months ago so declined repeat testing today.  He denies any known sick contacts.  He denies history of asthma, COPD, smoking.  He denies any recent antibiotic use.  He does have a history of recurrent sinus infections and states current symptoms are similar to previous episodes of this condition.   Past Medical History:  Diagnosis Date   H. pylori infection     There are no problems to display for this patient.   History reviewed. No pertinent surgical history.     Home Medications    Prior to Admission medications   Medication Sig Start Date End Date Taking? Authorizing Provider  amoxicillin-clavulanate (AUGMENTIN) 875-125 MG tablet Take 1 tablet by mouth every 12 (twelve) hours. 02/22/21  Yes Jacobi Nile K, PA-C  fluticasone (FLONASE) 50 MCG/ACT nasal spray Place 1 spray into both nostrils daily. 02/22/21  Yes Kaitlyn Skowron K, PA-C  ADDERALL XR 5 MG 24 hr capsule Take 5 mg by mouth every morning. 04/28/19   [provider]  amphetamine-dextroamphetamine (ADDERALL XR) 20 MG 24 hr capsule Take 20 mg by mouth daily.    [provider]  cetirizine (ZYRTEC ALLERGY) 10 MG tablet Take 1 tablet (10 mg total) by mouth daily. 02/22/20   Wallis Bamberg, PA-C  metoCLOPramide (REGLAN) 5 MG tablet Take 1 tablet (5 mg total) by mouth every 8 (eight) hours as needed for nausea or vomiting. 09/28/18 05/07/19  Menshew,  Charlesetta Ivory, PA-C    Family History Family History  Problem Relation Age of Onset   Healthy Mother    Hypertension Father    Healthy Sister    Healthy Brother     Social History Social History   Tobacco Use   Smoking status: Former    Types: Cigarettes    Quit date: 11/05/2018    Years since quitting: 2.3   Smokeless tobacco: Never  Vaping Use   Vaping Use: Former   Quit date: 01/05/2019  Substance Use Topics   Alcohol use: Yes   Drug use: Not Currently    Types: Marijuana     Allergies   Patient has no known allergies.   Review of Systems Review of Systems  Constitutional:  Positive for activity change. Negative for appetite change, fatigue and fever.  HENT:  Positive for congestion, sinus pressure and sore throat. Negative for sneezing.   Respiratory:  Negative for cough and shortness of breath.   Cardiovascular:  Negative for chest pain.  Gastrointestinal:  Negative for abdominal pain, diarrhea, nausea and vomiting.  Neurological:  Positive for headaches. Negative for dizziness and light-headedness.    Physical Exam Triage Vital Signs ED Triage Vitals  Enc Vitals Group     BP 02/22/21 1118 124/82     Pulse Rate 02/22/21 1118 86     Resp 02/22/21 1118 18     Temp 02/22/21 1118 97.7 F (36.5 C)  Temp Source 02/22/21 1118 Oral     SpO2 02/22/21 1118 99 %     Weight --      Height --      Head Circumference --      Peak Flow --      Pain Score 02/22/21 1116 6     Pain Loc --      Pain Edu? --      Excl. in GC? --    No data found.  Updated Vital Signs BP 124/82 (BP Location: Left Arm)   Pulse 86   Temp 97.7 F (36.5 C) (Oral)   Resp 18   SpO2 99%   Visual Acuity Right Eye Distance:   Left Eye Distance:   Bilateral Distance:    Right Eye Near:   Left Eye Near:    Bilateral Near:     Physical Exam Vitals reviewed.  Constitutional:      General: He is awake.     Appearance: Normal appearance. He is well-developed. He is not  ill-appearing.     Comments: Very pleasant male appears stated age no acute distress sitting comfortably in exam room  HENT:     Head: Normocephalic and atraumatic.     Right Ear: Tympanic membrane, ear canal and external ear normal. Tympanic membrane is not erythematous or bulging.     Left Ear: Tympanic membrane, ear canal and external ear normal. Tympanic membrane is not erythematous or bulging.     Nose:     Right Sinus: Maxillary sinus tenderness present. No frontal sinus tenderness.     Left Sinus: Maxillary sinus tenderness present. No frontal sinus tenderness.     Mouth/Throat:     Pharynx: Uvula midline. Posterior oropharyngeal erythema present. No oropharyngeal exudate.  Cardiovascular:     Rate and Rhythm: Normal rate and regular rhythm.     Heart sounds: Normal heart sounds, S1 normal and S2 normal. No murmur heard. Pulmonary:     Effort: Pulmonary effort is normal. No accessory muscle usage or respiratory distress.     Breath sounds: Normal breath sounds. No stridor. No wheezing, rhonchi or rales.     Comments: Clear to auscultation bilaterally Abdominal:     General: Bowel sounds are normal.     Palpations: Abdomen is soft.     Tenderness: There is no abdominal tenderness.  Neurological:     Mental Status: He is alert.  Psychiatric:        Behavior: Behavior is cooperative.     UC Treatments / Results  Labs (all labs ordered are listed, but only abnormal results are displayed) Labs Reviewed - No data to display  EKG   Radiology No results found.  Procedures Procedures (including critical care time)  Medications Ordered in UC Medications - No data to display  Initial Impression / Assessment and Plan / UC Course  I have reviewed the triage vital signs and the nursing notes.  Pertinent labs & imaging results that were available during my care of the patient were reviewed by me and considered in my medical decision making (see chart for details).       No indication for viral testing given patient has been symptomatic for over a week.  Given prolonged and worsening symptoms we will treat for sinus infection with Augmentin twice daily.  Discussed patient should continue allergy medication including antihistamines as well as sinus rinses and Flonase.  Discussed alarm symptoms that warrant emergent evaluation.  Strict return precautions given to which  she expressed understanding.  Final Clinical Impressions(s) / UC Diagnoses   Final diagnoses:  Acute non-recurrent pansinusitis     Discharge Instructions      We are treating you for sinus infection.  Please take Augmentin twice daily for a week.  Use your over-the-counter antihistamine as well as prescribed Flonase for allergy relief.  I also recommend you continue with your sinus rinses as we discussed.  If you have any worsening symptoms including fever, cough, shortness of breath, nausea, vomiting you need to be reevaluated.     ED Prescriptions     Medication Sig Dispense Auth. Provider   amoxicillin-clavulanate (AUGMENTIN) 875-125 MG tablet Take 1 tablet by mouth every 12 (twelve) hours. 14 tablet Domino Holten K, PA-C   fluticasone (FLONASE) 50 MCG/ACT nasal spray Place 1 spray into both nostrils daily. 16 g Bridget Westbrooks K, PA-C      PDMP not reviewed this encounter.   Jeani Hawking, PA-C 02/22/21 1134

## 2021-02-22 NOTE — Discharge Instructions (Addendum)
We are treating you for sinus infection.  Please take Augmentin twice daily for a week.  Use your over-the-counter antihistamine as well as prescribed Flonase for allergy relief.  I also recommend you continue with your sinus rinses as we discussed.  If you have any worsening symptoms including fever, cough, shortness of breath, nausea, vomiting you need to be reevaluated.

## 2021-02-22 NOTE — ED Triage Notes (Signed)
Pt reports nasal congestion, sore throat x 1 week. Sudafed gives no relief.   Pt reports he do not want to have a COVID test as he has some at home.

## 2021-03-05 ENCOUNTER — Emergency Department (HOSPITAL_BASED_OUTPATIENT_CLINIC_OR_DEPARTMENT_OTHER): Payer: BC Managed Care – PPO | Admitting: Radiology

## 2021-03-05 ENCOUNTER — Encounter (HOSPITAL_BASED_OUTPATIENT_CLINIC_OR_DEPARTMENT_OTHER): Payer: Self-pay | Admitting: *Deleted

## 2021-03-05 ENCOUNTER — Other Ambulatory Visit: Payer: Self-pay

## 2021-03-05 ENCOUNTER — Emergency Department (HOSPITAL_BASED_OUTPATIENT_CLINIC_OR_DEPARTMENT_OTHER)
Admission: EM | Admit: 2021-03-05 | Discharge: 2021-03-05 | Disposition: A | Discharge status: Home / Self Care | Payer: BC Managed Care – PPO | Attending: Emergency Medicine | Admitting: Emergency Medicine

## 2021-03-05 DIAGNOSIS — Z20822 Contact with and (suspected) exposure to covid-19: Secondary | ICD-10-CM | POA: Diagnosis not present

## 2021-03-05 DIAGNOSIS — Z87891 Personal history of nicotine dependence: Secondary | ICD-10-CM | POA: Diagnosis not present

## 2021-03-05 DIAGNOSIS — R0981 Nasal congestion: Secondary | ICD-10-CM | POA: Insufficient documentation

## 2021-03-05 DIAGNOSIS — R0602 Shortness of breath: Secondary | ICD-10-CM | POA: Diagnosis not present

## 2021-03-05 DIAGNOSIS — R062 Wheezing: Secondary | ICD-10-CM | POA: Diagnosis not present

## 2021-03-05 DIAGNOSIS — J069 Acute upper respiratory infection, unspecified: Secondary | ICD-10-CM

## 2021-03-05 LAB — RESP PANEL BY RT-PCR (FLU A&B, COVID) ARPGX2
Influenza A by PCR: NEGATIVE
Influenza B by PCR: NEGATIVE
SARS Coronavirus 2 by RT PCR: NEGATIVE

## 2021-03-05 MED ORDER — ALBUTEROL SULFATE HFA 108 (90 BASE) MCG/ACT IN AERS: 2.0000 | INHALATION_SPRAY | RESPIRATORY_TRACT | 1 refills | Status: AC | PRN

## 2021-03-05 MED ORDER — ALBUTEROL SULFATE HFA 108 (90 BASE) MCG/ACT IN AERS
2.0000 | INHALATION_SPRAY | RESPIRATORY_TRACT | Status: DC | PRN
  Administered 2021-03-05: 2 via RESPIRATORY_TRACT
  Filled 2021-03-05: qty 6.7

## 2021-03-05 MED ORDER — PREDNISONE 20 MG PO TABS: 60.0000 mg | ORAL_TABLET | Freq: Every day | ORAL | 0 refills | Status: AC

## 2021-03-05 MED ORDER — PREDNISONE 50 MG PO TABS
60.0000 mg | ORAL_TABLET | Freq: Once | ORAL | Status: AC
  Administered 2021-03-05: 60 mg via ORAL
  Filled 2021-03-05: qty 1

## 2021-03-05 MED ORDER — ALBUTEROL SULFATE HFA 108 (90 BASE) MCG/ACT IN AERS: 2.0000 | INHALATION_SPRAY | RESPIRATORY_TRACT | Status: DC | PRN

## 2021-03-05 NOTE — ED Provider Notes (Addendum)
[facility] EMERGENCY DEPT Provider Note   CSN: [id number] Arrival date & time: 03/05/21  7829     History Chief Complaint  Patient presents with   Shortness of Breath   Nasal Congestion    Tim Arnold is a 20 y.o. male.  Patient c/o non prod cough, nasal congestion, and wheezing/sob in past week. Symptoms acute onset, moderate, persistent. States hx asthma as child but no current asthma exacerbations or any regular mdi use. States recently treated w amox for sinusitis, but symptoms persist. Denies specific known ill contacts, flu or covid exposure. Denies chest pain. No leg pain or swelling. No fever/chills.   The history is provided by the patient and medical records.  Shortness of Breath Associated symptoms: cough and wheezing   Associated symptoms: no abdominal pain, no chest pain, no fever, no headaches, no neck pain, no rash, no sore throat and no vomiting       Past Medical History:  Diagnosis Date   H. pylori infection     There are no problems to display for this patient.   History reviewed. No pertinent surgical history.     Family History  Problem Relation Age of Onset   Healthy Mother    Hypertension Father    Healthy Sister    Healthy Brother     Social History   Tobacco Use   Smoking status: Former    Types: Cigarettes    Quit date: 11/05/2018    Years since quitting: 2.3   Smokeless tobacco: Never  Vaping Use   Vaping Use: Former   Quit date: 01/05/2019  Substance Use Topics   Alcohol use: Yes   Drug use: Not Currently    Types: Marijuana    Home Medications Prior to Admission medications   Medication Sig Start Date End Date Taking? Authorizing Provider  ADDERALL XR 5 MG 24 hr capsule Take 5 mg by mouth every morning. 04/28/19   [provider]  amoxicillin-clavulanate (AUGMENTIN) 875-125 MG tablet Take 1 tablet by mouth every 12 (twelve) hours. 02/22/21   Raspet, Noberto Retort, PA-C  amphetamine-dextroamphetamine  (ADDERALL XR) 20 MG 24 hr capsule Take 20 mg by mouth daily.    [provider]  cetirizine (ZYRTEC ALLERGY) 10 MG tablet Take 1 tablet (10 mg total) by mouth daily. 02/22/20   Wallis Bamberg, PA-C  fluticasone (FLONASE) 50 MCG/ACT nasal spray Place 1 spray into both nostrils daily. 02/22/21   Raspet, Noberto Retort, PA-C  metoCLOPramide (REGLAN) 5 MG tablet Take 1 tablet (5 mg total) by mouth every 8 (eight) hours as needed for nausea or vomiting. 09/28/18 05/07/19  Menshew, Charlesetta Ivory, PA-C    Allergies    Patient has no known allergies.  Review of Systems   Review of Systems  Constitutional:  Negative for fever.  HENT:  Positive for congestion. Negative for sore throat.   Eyes:  Negative for redness.  Respiratory:  Positive for cough, shortness of breath and wheezing.   Cardiovascular:  Negative for chest pain.  Gastrointestinal:  Negative for abdominal pain, diarrhea and vomiting.  Genitourinary:  Negative for flank pain.  Musculoskeletal:  Negative for neck pain and neck stiffness.  Skin:  Negative for rash.  Neurological:  Negative for headaches.  Hematological:  Does not bruise/bleed easily.  Psychiatric/Behavioral:  Negative for confusion.    Physical Exam Updated Vital Signs BP 130/83 (BP Location: Right Arm)   Pulse 92   Temp 97.9 F (36.6 C) (Oral)   Resp 18  Ht 1.93 m (6\' 4" )   Wt 77.6 kg   SpO2 95%   BMI 20.81 kg/m   Physical Exam Vitals and nursing note reviewed.  Constitutional:      Appearance: Normal appearance. He is well-developed.  HENT:     Head: Atraumatic.     Nose: Nose normal.     Mouth/Throat:     Mouth: Mucous membranes are moist.     Pharynx: Oropharynx is clear. No oropharyngeal exudate or posterior oropharyngeal erythema.  Eyes:     General: No scleral icterus.    Conjunctiva/sclera: Conjunctivae normal.  Neck:     Trachea: No tracheal deviation.  Cardiovascular:     Rate and Rhythm: Normal rate and regular rhythm.     Pulses: Normal  pulses.     Heart sounds: Normal heart sounds. No murmur heard.   No friction rub. No gallop.  Pulmonary:     Effort: Pulmonary effort is normal. No accessory muscle usage or respiratory distress.     Breath sounds: Wheezing present.  Abdominal:     General: Bowel sounds are normal. There is no distension.     Palpations: Abdomen is soft.     Tenderness: There is no abdominal tenderness.  Genitourinary:    Comments: No cva tenderness. Musculoskeletal:        General: No swelling or tenderness.     Cervical back: Normal range of motion and neck supple. No rigidity.     Right lower leg: No edema.     Left lower leg: No edema.  Skin:    General: Skin is warm and dry.     Findings: No rash.  Neurological:     Mental Status: He is alert.     Comments: Alert, speech clear.   Psychiatric:        Mood and Affect: Mood normal.    ED Results / Procedures / Treatments   Labs (all labs ordered are listed, but only abnormal results are displayed) Labs Reviewed  RESP PANEL BY RT-PCR (FLU A&B, COVID) [id number]    EKG None  Radiology DG Chest 2 View  Result Date: 03/05/2021 CLINICAL DATA:  Shortness of breath, nasal congestion, low O2 sats. EXAM: CHEST - 2 VIEW COMPARISON:  None. FINDINGS: Trachea is midline. Heart size normal. Lungs are mildly hyperinflated but clear. Minimal biapical pleural thickening. No pleural fluid. IMPRESSION: Minimal hyperinflation without acute finding. Electronically Signed   By: 05/05/2021 M.D.   On: 03/05/2021 11:08    Procedures Procedures   Medications Ordered in ED Medications  albuterol (VENTOLIN HFA) 108 (90 Base) MCG/ACT inhaler 2 puff (2 puffs Inhalation Given 03/05/21 1007)  albuterol (VENTOLIN HFA) 108 (90 Base) MCG/ACT inhaler 2 puff (has no administration in time range)  predniSONE (DELTASONE) tablet 60 mg (60 mg Oral Given 03/05/21 1135)    ED Course  I have reviewed the triage vital signs and the nursing notes.  Pertinent labs &  imaging results that were available during my care of the patient were reviewed by me and considered in my medical decision making (see chart for details).    MDM Rules/Calculators/A&P                          CXR. Albuterol tx.   Reviewed nursing notes and prior charts for additional history.   CXR reviewed/interpreted by me - no pna.   Labs reviewed/interpreted by me - covid neg.   Persistent wheezing. Prednisone po.  Albuterol mdi.  Recheck, pt breathing comfortably. Pt appears stable for d/c.      Final Clinical Impression(s) / ED Diagnoses Final diagnoses:  None    Rx / DC Orders ED Discharge Orders     None          Cathren Laine, MD 03/05/21 1231

## 2021-03-05 NOTE — Discharge Instructions (Addendum)
It was our pleasure to provide your ER care today - we hope that you feel better.  Drink adequate fluids/stay well hydrated.   Take prednisone as prescribed. Use albuterol inhaler as need.  Follow up with primary care doctor in one week if symptoms fail to improve/resolve.  Return to ER if worse, new symptoms, increased trouble breathing, high fevers, chest pain, or other concern.

## 2021-03-05 NOTE — ED Triage Notes (Signed)
Reports a week of congestion and SHOB, Breathing treatment given by EMS last night. Nasal drainage  with yellow/brown tint.

## 2021-03-08 ENCOUNTER — Telehealth: Payer: Self-pay | Admitting: Internal Medicine

## 2021-03-08 NOTE — Telephone Encounter (Signed)
Patient scheduled 03/14/21 with Dr. Celine Mans at 0930 for asthma consult.  Per asthma protocol, Ige and cbc w/ diff within 1 year.  Cbc w/ diff was 08/25/20, but there is no Ige. ATC Patient. LM regarding needed lab. Ige order pended.

## 2021-03-09 ENCOUNTER — Encounter: Payer: Self-pay | Admitting: *Deleted

## 2021-03-09 NOTE — Telephone Encounter (Signed)
I have left a message on Patient's VM and also sent a message through my chart regarding labs.

## 2021-03-14 ENCOUNTER — Institutional Professional Consult (permissible substitution): Payer: BC Managed Care – PPO | Admitting: Internal Medicine

## 2021-06-23 ENCOUNTER — Emergency Department (HOSPITAL_COMMUNITY): Payer: BC Managed Care – PPO

## 2021-06-23 ENCOUNTER — Emergency Department (HOSPITAL_COMMUNITY)
Admission: EM | Admit: 2021-06-23 | Discharge: 2021-06-23 | Disposition: A | Discharge status: Home / Self Care | Payer: BC Managed Care – PPO | Attending: Emergency Medicine | Admitting: Emergency Medicine

## 2021-06-23 ENCOUNTER — Encounter (HOSPITAL_COMMUNITY): Payer: Self-pay | Admitting: Emergency Medicine

## 2021-06-23 ENCOUNTER — Other Ambulatory Visit: Payer: Self-pay

## 2021-06-23 DIAGNOSIS — S40211A Abrasion of right shoulder, initial encounter: Secondary | ICD-10-CM | POA: Insufficient documentation

## 2021-06-23 DIAGNOSIS — S3991XA Unspecified injury of abdomen, initial encounter: Secondary | ICD-10-CM | POA: Diagnosis not present

## 2021-06-23 DIAGNOSIS — F10120 Alcohol abuse with intoxication, uncomplicated: Secondary | ICD-10-CM | POA: Insufficient documentation

## 2021-06-23 DIAGNOSIS — T07XXXA Unspecified multiple injuries, initial encounter: Secondary | ICD-10-CM

## 2021-06-23 DIAGNOSIS — S299XXA Unspecified injury of thorax, initial encounter: Secondary | ICD-10-CM | POA: Insufficient documentation

## 2021-06-23 DIAGNOSIS — F10920 Alcohol use, unspecified with intoxication, uncomplicated: Secondary | ICD-10-CM

## 2021-06-23 DIAGNOSIS — R000 Tachycardia, unspecified: Secondary | ICD-10-CM | POA: Diagnosis not present

## 2021-06-23 DIAGNOSIS — W19XXXA Unspecified fall, initial encounter: Secondary | ICD-10-CM

## 2021-06-23 DIAGNOSIS — S0990XA Unspecified injury of head, initial encounter: Secondary | ICD-10-CM | POA: Diagnosis present

## 2021-06-23 DIAGNOSIS — Y92410 Unspecified street and highway as the place of occurrence of the external cause: Secondary | ICD-10-CM | POA: Insufficient documentation

## 2021-06-23 DIAGNOSIS — S0081XA Abrasion of other part of head, initial encounter: Secondary | ICD-10-CM | POA: Diagnosis not present

## 2021-06-23 MED ORDER — IOHEXOL 300 MG/ML  SOLN
100.0000 mL | Freq: Once | INTRAMUSCULAR | Status: AC | PRN
  Administered 2021-06-23: 100 mL via INTRAVENOUS

## 2021-06-23 MED ORDER — BACITRACIN ZINC 500 UNIT/GM EX OINT
TOPICAL_OINTMENT | Freq: Two times a day (BID) | CUTANEOUS | Status: DC
  Filled 2021-06-23: qty 1.8

## 2021-06-23 MED ORDER — BACITRACIN ZINC 500 UNIT/GM EX OINT
TOPICAL_OINTMENT | Freq: Once | CUTANEOUS | Status: AC
  Administered 2021-06-23: 1 via TOPICAL
  Filled 2021-06-23: qty 1.8

## 2021-06-23 NOTE — Discharge Instructions (Signed)
Your testing is negative for serious traumatic injury.  Cut back on your drinking and follow-up with your primary doctor.  Keep the wounds clean and dry and apply bacitracin 2-3 times daily.  Return to the ED with new or worsening symptoms.
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
## 2021-06-23 NOTE — ED Notes (Signed)
Bacitracin applied to R shoulder and R side of face, inferior R eye
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
